# Patient Record
Sex: Female | Born: 1980 | Race: White | Hispanic: No | Smoking: Former smoker
Health system: Southern US, Community
[De-identification: ages and names within clinical notes are randomized; demographics above are authoritative.]

## PROBLEM LIST (undated history)

## (undated) DIAGNOSIS — T7840XA Allergy, unspecified, initial encounter: Secondary | ICD-10-CM

## (undated) DIAGNOSIS — R002 Palpitations: Secondary | ICD-10-CM

## (undated) DIAGNOSIS — C210 Malignant neoplasm of anus, unspecified: Secondary | ICD-10-CM

## (undated) DIAGNOSIS — T8859XA Other complications of anesthesia, initial encounter: Secondary | ICD-10-CM

## (undated) DIAGNOSIS — I499 Cardiac arrhythmia, unspecified: Secondary | ICD-10-CM

## (undated) DIAGNOSIS — F419 Anxiety disorder, unspecified: Secondary | ICD-10-CM

## (undated) HISTORY — DX: Allergy, unspecified, initial encounter: T78.40XA

## (undated) HISTORY — DX: Anxiety disorder, unspecified: F41.9

## (undated) HISTORY — PX: TUBAL LIGATION: SHX77

## (undated) HISTORY — DX: Malignant neoplasm of anus, unspecified: C21.0

---

## 2001-12-08 ENCOUNTER — Other Ambulatory Visit: Admission: RE | Admit: 2001-12-08 | Discharge: 2001-12-08 | Payer: Self-pay | Admitting: Family Medicine

## 2001-12-10 ENCOUNTER — Encounter: Payer: Self-pay | Admitting: Internal Medicine

## 2001-12-10 ENCOUNTER — Ambulatory Visit (HOSPITAL_COMMUNITY): Admission: RE | Admit: 2001-12-10 | Discharge: 2001-12-10 | Payer: Self-pay | Admitting: Internal Medicine

## 2002-07-24 ENCOUNTER — Emergency Department (HOSPITAL_COMMUNITY): Admission: EM | Admit: 2002-07-24 | Discharge: 2002-07-24 | Payer: Self-pay | Admitting: *Deleted

## 2002-11-17 HISTORY — PX: TUBAL LIGATION: SHX77

## 2003-01-18 ENCOUNTER — Ambulatory Visit (HOSPITAL_COMMUNITY): Admission: AD | Admit: 2003-01-18 | Discharge: 2003-01-18 | Payer: Self-pay | Admitting: Obstetrics and Gynecology

## 2003-01-26 ENCOUNTER — Inpatient Hospital Stay (HOSPITAL_COMMUNITY): Admission: AD | Admit: 2003-01-26 | Discharge: 2003-01-29 | Payer: Self-pay | Admitting: Obstetrics and Gynecology

## 2003-04-18 ENCOUNTER — Ambulatory Visit (HOSPITAL_COMMUNITY): Admission: RE | Admit: 2003-04-18 | Discharge: 2003-04-18 | Payer: Self-pay | Admitting: Obstetrics & Gynecology

## 2006-02-26 ENCOUNTER — Emergency Department (HOSPITAL_COMMUNITY): Admission: EM | Admit: 2006-02-26 | Discharge: 2006-02-26 | Payer: Self-pay | Admitting: Emergency Medicine

## 2011-07-21 ENCOUNTER — Emergency Department (HOSPITAL_COMMUNITY)
Admission: EM | Admit: 2011-07-21 | Discharge: 2011-07-21 | Disposition: A | Discharge status: Home / Self Care | Payer: Medicaid Other | Attending: Emergency Medicine | Admitting: Emergency Medicine

## 2011-07-21 DIAGNOSIS — J329 Chronic sinusitis, unspecified: Secondary | ICD-10-CM | POA: Insufficient documentation

## 2011-07-21 DIAGNOSIS — F17200 Nicotine dependence, unspecified, uncomplicated: Secondary | ICD-10-CM | POA: Insufficient documentation

## 2011-07-21 MED ORDER — AMOXICILLIN 500 MG PO CAPS: 500.0000 mg | ORAL_CAPSULE | Freq: Three times a day (TID) | ORAL | Status: AC

## 2011-07-21 NOTE — ED Provider Notes (Signed)
History     CSN: [id number] Arrival date & time: 07/21/2011 11:34 AM  Chief Complaint  Patient presents with  . Sore Throat  . Cough  . Fever   Patient is a 30 y.o. female presenting with pharyngitis, cough, and fever. The history is provided by the patient.  Sore Throat The current episode started in the past 7 days (3 days ago). Associated symptoms include congestion, coughing, a fever and a sore throat. Associated symptoms comments: Sinus pressure with purulent nasal discharge.. Treatments tried: cough syrup. The treatment provided no relief.  Cough Associated symptoms include sore throat.  Fever Primary symptoms of the febrile illness include fever and cough.    History reviewed. No pertinent past medical history.  Past Surgical History  Procedure Date  . Tubal ligation     No family history on file.  History  Substance Use Topics  . Smoking status: Current Everyday Smoker  . Smokeless tobacco: Not on file  . Alcohol Use: No    OB History    Grav Para Term Preterm Abortions TAB SAB Ect Mult Living                  Review of Systems  Constitutional: Positive for fever.  HENT: Positive for congestion and sore throat.   Respiratory: Positive for cough.   All other systems reviewed and are negative.    Physical Exam  BP 127/83  Pulse 99  Temp(Src) 98.8 F (37.1 C) (Oral)  Resp 20  Ht 5\' 6"  (1.676 m)  Wt 170 lb (77.111 kg)  BMI 27.44 kg/m2  SpO2 97%  LMP 07/14/2011  Physical Exam  Nursing note and vitals reviewed. Constitutional: She is oriented to person, place, and time. She appears well-developed and well-nourished.  HENT:  Head: Normocephalic and atraumatic.  Right Ear: Tympanic membrane normal.  Left Ear: Tympanic membrane normal.  Nose: Mucosal edema and rhinorrhea present. Right sinus exhibits maxillary sinus tenderness. Left sinus exhibits maxillary sinus tenderness.  Mouth/Throat: Uvula is midline, oropharynx is clear and moist and mucous  membranes are normal.  Eyes: Conjunctivae are normal.  Neck: Normal range of motion.  Cardiovascular: Normal rate, regular rhythm, normal heart sounds and intact distal pulses.   Pulmonary/Chest: Effort normal and breath sounds normal. She has no wheezes.  Abdominal: Soft. Bowel sounds are normal. There is no tenderness.  Musculoskeletal: Normal range of motion.  Neurological: She is alert and oriented to person, place, and time.  Skin: Skin is warm and dry.  Psychiatric: She has a normal mood and affect.    ED Course  Procedures  MDM Sinusitis.      Candis Musa, PA 07/21/11 1301

## 2011-07-21 NOTE — ED Notes (Signed)
Pt reports sore throat, fever, and cough since Friday.  Pt reports hx of strep throat.

## 2011-07-21 NOTE — ED Notes (Signed)
Cough, sore throat that started Friday, recent exposure to sick contacts

## 2011-07-22 NOTE — ED Provider Notes (Signed)
Medical screening examination/treatment/procedure(s) were performed by non-physician practitioner and as supervising physician I was immediately available for consultation/collaboration.   Sayde Lish L Orpah Hausner, MD 07/22/11 1317 

## 2012-08-11 ENCOUNTER — Encounter (HOSPITAL_COMMUNITY): Payer: Self-pay | Admitting: Emergency Medicine

## 2012-08-11 ENCOUNTER — Emergency Department (HOSPITAL_COMMUNITY): Payer: Medicaid Other

## 2012-08-11 ENCOUNTER — Emergency Department (HOSPITAL_COMMUNITY)
Admission: EM | Admit: 2012-08-11 | Discharge: 2012-08-11 | Disposition: A | Discharge status: Home / Self Care | Payer: Medicaid Other | Attending: Emergency Medicine | Admitting: Emergency Medicine

## 2012-08-11 DIAGNOSIS — R059 Cough, unspecified: Secondary | ICD-10-CM | POA: Insufficient documentation

## 2012-08-11 DIAGNOSIS — F17200 Nicotine dependence, unspecified, uncomplicated: Secondary | ICD-10-CM | POA: Insufficient documentation

## 2012-08-11 DIAGNOSIS — J40 Bronchitis, not specified as acute or chronic: Secondary | ICD-10-CM

## 2012-08-11 DIAGNOSIS — J329 Chronic sinusitis, unspecified: Secondary | ICD-10-CM

## 2012-08-11 DIAGNOSIS — R05 Cough: Secondary | ICD-10-CM | POA: Insufficient documentation

## 2012-08-11 MED ORDER — DOXYCYCLINE HYCLATE 100 MG PO CAPS: 100.0000 mg | ORAL_CAPSULE | Freq: Two times a day (BID) | ORAL | Status: DC

## 2012-08-11 MED ORDER — ALBUTEROL SULFATE HFA 108 (90 BASE) MCG/ACT IN AERS
2.0000 | INHALATION_SPRAY | RESPIRATORY_TRACT | Status: DC
  Administered 2012-08-11: 2 via RESPIRATORY_TRACT
  Filled 2012-08-11: qty 6.7

## 2012-08-11 MED ORDER — PSEUDOEPHEDRINE HCL 60 MG PO TABS: ORAL_TABLET | ORAL | Status: DC

## 2012-08-11 MED ORDER — PROMETHAZINE-CODEINE 6.25-10 MG/5ML PO SYRP: 5.0000 mL | ORAL_SOLUTION | Freq: Four times a day (QID) | ORAL | Status: DC | PRN

## 2012-08-11 MED ORDER — PREDNISONE 20 MG PO TABS
60.0000 mg | ORAL_TABLET | Freq: Once | ORAL | Status: AC
  Administered 2012-08-11: 60 mg via ORAL
  Filled 2012-08-11: qty 3

## 2012-08-11 NOTE — ED Notes (Signed)
Pt c/o cough, runny nose, and chest congestion x 2 months. Pt states she was seen at urgent care and treated for sinus infection.

## 2012-08-11 NOTE — ED Provider Notes (Signed)
History     CSN: [id number]  Arrival date & time 08/11/12  1547   First MD Initiated Contact with Patient 08/11/12 1814      Chief Complaint  Patient presents with  . Cough    (Consider location/radiation/quality/duration/timing/severity/associated sxs/prior treatment) HPI Comments: Patient reports problem with cough runny nose and congestion for approximately 2 months. The patient states that this problem comes and goes. The patient has been seen at the urgent care where she was treated for sinus infection and treated with steroids. She states that she had to stop the steroid because they made her" feel funny" the patient has not had any high fever. She's not had any hemoptysis. His been no unusual tick bites reported. The patient has not had any problems compromising her immune system. It is of note that she is a daily smoker.  The history is provided by the patient.    History reviewed. No pertinent past medical history.  Past Surgical History  Procedure Date  . Tubal ligation     History reviewed. No pertinent family history.  History  Substance Use Topics  . Smoking status: Current Every Day Smoker  . Smokeless tobacco: Not on file  . Alcohol Use: No    OB History    Grav Para Term Preterm Abortions TAB SAB Ect Mult Living                  Review of Systems  Constitutional: Negative for activity change.       All ROS Neg except as noted in HPI  HENT: Positive for congestion and postnasal drip. Negative for nosebleeds and neck pain.   Eyes: Negative for photophobia and discharge.  Respiratory: Positive for cough. Negative for shortness of breath and wheezing.   Cardiovascular: Negative for chest pain and palpitations.  Gastrointestinal: Negative for abdominal pain and blood in stool.  Genitourinary: Negative for dysuria, frequency and hematuria.  Musculoskeletal: Negative for back pain and arthralgias.  Skin: Negative.   Neurological: Negative for dizziness,  seizures and speech difficulty.  Psychiatric/Behavioral: Negative for hallucinations and confusion.    Allergies  Review of patient's allergies indicates no known allergies.  Home Medications   Current Outpatient Rx  Name Route Sig Dispense Refill  . PHENYLEPHRINE-GUAIFENESIN 2.5-50 MG/5ML PO LIQD Oral Take 10 mLs by mouth.        BP 124/85  Pulse 78  Temp 97.7 F (36.5 C) (Oral)  Resp 22  Ht 5\' 7"  (1.702 m)  Wt 200 lb (90.719 kg)  BMI 31.32 kg/m2  SpO2 98%  LMP 07/28/2012  Physical Exam  Nursing note and vitals reviewed. Constitutional: She is oriented to person, place, and time. She appears well-developed and well-nourished.  Non-toxic appearance.  HENT:  Head: Normocephalic.  Right Ear: Tympanic membrane and external ear normal.  Left Ear: Tympanic membrane and external ear normal.       Nasal congestion present  Eyes: EOM and lids are normal. Pupils are equal, round, and reactive to light.  Neck: Normal range of motion. Neck supple. Carotid bruit is not present.  Cardiovascular: Normal rate, regular rhythm, normal heart sounds, intact distal pulses and normal pulses.   Pulmonary/Chest: Breath sounds normal. No respiratory distress.       Course sounds present with bilateral rhonchi and soft wheezes. No retractions of the chest wall.  Abdominal: Soft. Bowel sounds are normal. There is no tenderness. There is no guarding.  Musculoskeletal: Normal range of motion.  Lymphadenopathy:  Head (right side): No submandibular adenopathy present.       Head (left side): No submandibular adenopathy present.    She has no cervical adenopathy.  Neurological: She is alert and oriented to person, place, and time. She has normal strength. No cranial nerve deficit or sensory deficit.  Skin: Skin is warm and dry.  Psychiatric: She has a normal mood and affect. Her speech is normal.    ED Course  Procedures (including critical care time)  Labs Reviewed - No data to display No  results found. Pulse oximetry 98% on room air. Within normal limits by my interpretation.  No diagnosis found.    MDM  I have reviewed nursing notes, vital signs, and all appropriate lab and imaging results for this patient. The chest x-ray is read as normal. The patient is treated with an albuterol inhaler 2 puffs every 4 hours given here in the emergency department. Prescription given for Sudafed 3 times daily for congestion, promethazine cough medication every 6 hours with warning of possible drowsiness. Doxycycline 1 tablet twice daily with food.       Kathie Dike, [place] 08/11/12 1956

## 2012-08-11 NOTE — ED Notes (Signed)
Pt presents with with sore throat, chest discomfort/soreness, non productive cough and generalized body aches. Pt states [facility] and treated with steroid shot and a z-pack with no improvement. BBS with audible rales, equal. NAD noted.

## 2012-08-12 NOTE — ED Provider Notes (Signed)
Medical screening examination/treatment/procedure(s) were performed by non-physician practitioner and as supervising physician I was immediately available for consultation/collaboration. Devoria Albe, MD, FACEP   Ward Givens, MD 08/12/12 [phone]

## 2019-12-17 ENCOUNTER — Emergency Department (HOSPITAL_COMMUNITY)
Admission: EM | Admit: 2019-12-17 | Discharge: 2019-12-17 | Disposition: A | Discharge status: Home / Self Care | Payer: Self-pay | Attending: Emergency Medicine | Admitting: Emergency Medicine

## 2019-12-17 ENCOUNTER — Other Ambulatory Visit: Payer: Self-pay

## 2019-12-17 ENCOUNTER — Encounter (HOSPITAL_COMMUNITY): Payer: Self-pay | Admitting: Emergency Medicine

## 2019-12-17 DIAGNOSIS — K047 Periapical abscess without sinus: Secondary | ICD-10-CM | POA: Insufficient documentation

## 2019-12-17 DIAGNOSIS — F17200 Nicotine dependence, unspecified, uncomplicated: Secondary | ICD-10-CM | POA: Insufficient documentation

## 2019-12-17 MED ORDER — AMOXICILLIN 500 MG PO CAPS
500.0000 mg | ORAL_CAPSULE | Freq: Once | ORAL | Status: AC
  Administered 2019-12-17: 500 mg via ORAL
  Filled 2019-12-17: qty 1

## 2019-12-17 MED ORDER — AMOXICILLIN 500 MG PO CAPS: 500.0000 mg | ORAL_CAPSULE | Freq: Two times a day (BID) | ORAL | 0 refills | Status: DC

## 2019-12-17 MED ORDER — HYDROCODONE-ACETAMINOPHEN 5-325 MG PO TABS: 1.0000 | ORAL_TABLET | Freq: Four times a day (QID) | ORAL | 0 refills | Status: DC | PRN

## 2019-12-17 MED ORDER — IBUPROFEN 200 MG PO TABS
400.0000 mg | ORAL_TABLET | Freq: Once | ORAL | Status: AC
  Administered 2019-12-17: 400 mg via ORAL
  Filled 2019-12-17: qty 2

## 2019-12-17 NOTE — ED Triage Notes (Signed)
Pt woke to left facial swelling

## 2019-12-17 NOTE — ED Provider Notes (Signed)
[facility]-EMERGENCY DEPT Provider Note   CSN: [id number] Arrival date & time: 12/17/19  0320     History Chief Complaint  Patient presents with  . Oral Swelling    Marie Duncan is a 39 y.o. female.  The history is provided by the patient.  Dental Pain Location:  Upper Quality:  Aching Severity:  Moderate Onset quality:  Gradual Duration:  1 day Timing:  Constant Progression:  Worsening Chronicity:  New Relieved by:  Nothing Worsened by:  Jaw movement and pressure Associated symptoms: no difficulty swallowing and no fever   Patient reports left facial swelling and dental pain for the past day.  No visual changes.  No vomiting.      PMH-none Past Surgical History:  Procedure Laterality Date  . TUBAL LIGATION       OB History   No obstetric history on file.     History reviewed. No pertinent family history.  Social History   Tobacco Use  . Smoking status: Current Every Day Smoker  . Smokeless tobacco: Never Used  Substance Use Topics  . Alcohol use: No  . Drug use: No    Home Medications Prior to Admission medications   Medication Sig Start Date End Date Taking? Authorizing Provider  amoxicillin (AMOXIL) 500 MG capsule Take 1 capsule (500 mg total) by mouth 2 (two) times daily. 12/17/19   Zadie Rhine, MD  HYDROcodone-acetaminophen (NORCO/VICODIN) 5-325 MG tablet Take 1 tablet by mouth every 6 (six) hours as needed for severe pain. 12/17/19   Zadie Rhine, MD    Allergies    Penicillins  Review of Systems   Review of Systems  Constitutional: Negative for fever.  HENT: Positive for dental problem. Negative for trouble swallowing.   Gastrointestinal: Negative for vomiting.    Physical Exam Updated Vital Signs BP 108/64 (BP Location: Right Arm)   Pulse 69   Temp 98.1 F (36.7 C) (Oral)   Resp 15   SpO2 98%   Physical Exam CONSTITUTIONAL: Well developed/well nourished HEAD AND FACE:  Normocephalic/atraumatic EYES: EOMI/PERRL, no proptosis ENMT: Mucous membranes moist.  Poor dentition.  No trismus.  No focal abscess noted.  Tenderness along left upper gingiva Mild facial swelling noted. NECK: supple no meningeal signs CV: S1/S2 noted, no murmurs/rubs/gallops noted LUNGS: Lungs are clear to auscultation bilaterally, no apparent distress ABDOMEN: soft, nontender, no rebound or guarding NEURO: Pt is awake/alert, moves all extremitiesx4 EXTREMITIES:full ROM SKIN: warm, color normal  ED Results / Procedures / Treatments   Labs (all labs ordered are listed, but only abnormal results are displayed) Labs Reviewed - No data to display  EKG None  Radiology No results found.  Procedures Procedures  Medications Ordered in ED Medications  amoxicillin (AMOXIL) capsule 500 mg (500 mg Oral Given 12/17/19 0448)  ibuprofen (ADVIL) tablet 400 mg (400 mg Oral Given 12/17/19 0448)    ED Course  I have reviewed the triage vital signs and the nursing notes.     MDM Rules/Calculators/A&P                      Will refer to dentistry.  Amoxicillin was  started, she reports she can take this without any side effects Final Clinical Impression(s) / ED Diagnoses Final diagnoses:  Dental abscess    Rx / DC Orders ED Discharge Orders         Ordered    amoxicillin (AMOXIL) 500 MG capsule  2 times daily  12/17/19 0440    HYDROcodone-acetaminophen (NORCO/VICODIN) 5-325 MG tablet  Every 6 hours PRN     12/17/19 0530           Ripley Fraise, MD 12/17/19 [phone]

## 2019-12-18 ENCOUNTER — Encounter (HOSPITAL_COMMUNITY): Payer: Self-pay | Admitting: Emergency Medicine

## 2019-12-18 ENCOUNTER — Other Ambulatory Visit: Payer: Self-pay

## 2019-12-18 ENCOUNTER — Emergency Department (HOSPITAL_COMMUNITY)
Admission: EM | Admit: 2019-12-18 | Discharge: 2019-12-18 | Disposition: A | Discharge status: Home / Self Care | Payer: Self-pay | Attending: Emergency Medicine | Admitting: Emergency Medicine

## 2019-12-18 DIAGNOSIS — F17210 Nicotine dependence, cigarettes, uncomplicated: Secondary | ICD-10-CM | POA: Insufficient documentation

## 2019-12-18 DIAGNOSIS — K0889 Other specified disorders of teeth and supporting structures: Secondary | ICD-10-CM

## 2019-12-18 DIAGNOSIS — K047 Periapical abscess without sinus: Secondary | ICD-10-CM

## 2019-12-18 DIAGNOSIS — R220 Localized swelling, mass and lump, head: Secondary | ICD-10-CM

## 2019-12-18 MED ORDER — CHLORHEXIDINE GLUCONATE 0.12 % MT SOLN: 15.0000 mL | Freq: Two times a day (BID) | OROMUCOSAL | 0 refills | Status: DC

## 2019-12-18 MED ORDER — CLINDAMYCIN HCL 150 MG PO CAPS: 300.0000 mg | ORAL_CAPSULE | Freq: Three times a day (TID) | ORAL | 0 refills | Status: AC

## 2019-12-18 MED ORDER — IBUPROFEN 600 MG PO TABS: 600.0000 mg | ORAL_TABLET | Freq: Four times a day (QID) | ORAL | 0 refills | Status: DC | PRN

## 2019-12-18 NOTE — Discharge Instructions (Addendum)
Please take all of your antibiotics until finished!  Stop taking amoxicillin and start taking clindamycin.  Take your antibiotics with food.  Common side effects of antibiotics include nausea, vomiting, abdominal discomfort, and diarrhea. You may help offset some of this with probiotics which you can buy or get in yogurt. Do not eat  or take the probiotics until 2 hours after your antibiotic.    Some studies suggest that certain antibiotics can reduce the efficacy of certain oral contraceptive pills (birth control), so please use additional contraceptives (condoms or other barrier method) while you are taking the antibiotics and for an additional 5 to 7 days afterwards if you are a female on these medications.   Apply warm or cool compresses to jaw throughout the day, whichever feels best.  20 minutes at a time then at least 30 minutes off before applying again.  Alternate 600 mg of ibuprofen with food and [phone] mg of Tylenol every 3 hours as needed for pain. Do not exceed 4000 mg of Tylenol daily.  You may also use warm water salt gargles, Orajel, or other over-the-counter dental pain remedies. Use Peridex mouthwash twice daily.  Followup with a dentist is very important for ongoing evaluation and management of recurrent dental pain.  I have given you the information for the dentist on-call today as well as resources for other dentists in the area that are more affordable.  Return to emergency department for emergent changing or worsening symptoms such as fever, worsening facial swelling, difficulty breathing or swallowing, throat tightness, or vision changes.

## 2019-12-18 NOTE — ED Provider Notes (Signed)
[place] DEPT Provider Note   CSN: [id number] Arrival date & time: 12/18/19  0844     History Chief Complaint  Patient presents with   Dental Pain    Marie Duncan is a 39 y.o. female presents today for evaluation of acute onset, progressively worsening left maxillary dental pain for 3 days.  Reports feeling mild aching pain along the left maxilla with mild swelling on Friday.  Pain is throbbing, radiates all over the left side of the face. She was seen in the ED yesterday due to worsening symptoms and was started on amoxicillin and given hydrocodone and sent home.  She reports that when she awoke today her pain was worse and she had swelling under her left eyelid.  She denies eye pain, abnormal drainage from the eye, vision changes, pain with eye movements, fevers or injury to the eye.  She does not wear contact lenses.  She has been drinking cold water and applying cool compresses with improvement.  She also took Advil PM and hydrocodone with some relief.  She denies difficulty eating or swallowing.  She called the dentist she was referred to yesterday but reports they are not open until Tuesday.  She has had 3 doses of amoxicillin so far.  The history is provided by the patient.       History reviewed. No pertinent past medical history.  There are no problems to display for this patient.   Past Surgical History:  Procedure Laterality Date   TUBAL LIGATION       OB History   No obstetric history on file.     No family history on file.  Social History   Tobacco Use   Smoking status: Current Every Day Smoker   Smokeless tobacco: Never Used  Substance Use Topics   Alcohol use: No   Drug use: No    Home Medications Prior to Admission medications   Medication Sig Start Date End Date Taking? Authorizing Provider  amoxicillin (AMOXIL) 500 MG capsule Take 1 capsule (500 mg total) by mouth 2 (two) times daily. 12/17/19   Ripley Fraise, MD  chlorhexidine (PERIDEX) 0.12 % solution Use as directed 15 mLs in the mouth or throat 2 (two) times daily. 12/18/19   Nils Flack, Brookelin Felber A, PA-C  clindamycin (CLEOCIN) 150 MG capsule Take 2 capsules (300 mg total) by mouth 3 (three) times daily for 7 days. 12/18/19 12/25/19  Rodell Perna A, PA-C  HYDROcodone-acetaminophen (NORCO/VICODIN) 5-325 MG tablet Take 1 tablet by mouth every 6 (six) hours as needed for severe pain. 12/17/19   Ripley Fraise, MD  ibuprofen (ADVIL) 600 MG tablet Take 1 tablet (600 mg total) by mouth every 6 (six) hours as needed. 12/18/19   Snyder Colavito A, PA-C    Allergies    Penicillins  Review of Systems   Review of Systems  Constitutional: Negative for chills and fever.  HENT: Positive for dental problem and facial swelling. Negative for trouble swallowing and voice change.   Eyes: Negative for photophobia, pain, discharge, redness, itching and visual disturbance.  Respiratory: Negative for shortness of breath.   Musculoskeletal: Negative for neck pain and neck stiffness.  All other systems reviewed and are negative.   Physical Exam Updated Vital Signs BP (!) 151/84 (BP Location: Left Arm)    Pulse 87    Temp 97.8 F (36.6 C) (Oral)    Resp 18    SpO2 100%   Physical Exam Vitals and nursing note reviewed.  Constitutional:      General: She is not in acute distress.    Appearance: She is well-developed.  HENT:     Head: Atraumatic.     Mouth/Throat:     Comments: Diffusely decaying dentition.  Patient with tenderness to percussion of left first maxillary molar.  Mild gingival irritation noted.  Mouth opens to around 3 finger widths.  Tolerating secretions without difficulty.  No abnormal phonation.  No abnormalities of the posterior oropharynx or sublingual region.  No submandibular or submental tenderness.  No tenderness to anterior structures of neck. Eyes:     General:        Right eye: No discharge.        Left eye: No discharge.     Extraocular  Movements: Extraocular movements intact.     Conjunctiva/sclera: Conjunctivae normal.     Pupils: Pupils are equal, round, and reactive to light.     Comments: See below image.  Patient with swelling to the left lower eyelid.  Mild discomfort on palpation.  No erythema or induration.  No abnormal drainage.  No conjunctival injection.  No chemosis, proptosis or consensual photophobia.  No pain or restriction with EOMs.  Neck:     Vascular: No JVD.     Trachea: No tracheal deviation.  Cardiovascular:     Rate and Rhythm: Normal rate.  Pulmonary:     Effort: Pulmonary effort is normal.  Abdominal:     General: There is no distension.  Musculoskeletal:     Cervical back: Normal range of motion and neck supple. No rigidity or tenderness.  Skin:    General: Skin is warm and dry.     Findings: No erythema.  Neurological:     Mental Status: She is alert.  Psychiatric:        Behavior: Behavior normal.       ED Results / Procedures / Treatments   Labs (all labs ordered are listed, but only abnormal results are displayed) Labs Reviewed - No data to display  EKG None  Radiology No results found.  Procedures Procedures (including critical care time)  Medications Ordered in ED Medications - No data to display  ED Course  I have reviewed the triage vital signs and the nursing notes.  Pertinent labs & imaging results that were available during my care of the patient were reviewed by me and considered in my medical decision making (see chart for details).    MDM Rules/Calculators/A&P                      Patient with dental pain with associated abscess, though this is not amenable to drainage in the ED. Patient is afebrile, vital signs are stable (she is mildly hypertensive though this is likely secondary to her pain).  Patient is nontoxic in appearance and is tolerating secretions without difficulty.  Exam unconcerning for Ludwig's angina, peritonsillar abscess, strep  pharyngitis, meningitis, deep space neck infection. She has left lower eyelid swelling but no erythema or abnormal drainage. No restriction or pain with eye movements, no vision changes. Doubt orbital cellulitis or ocular nerve entrapment at this time.  No evidence of airway compromise, she is able to tolerate sips of fluid in the ED.  We will switch her from amoxil to clindamycin.  Also discussed utility of anti-inflammatories, Tylenol, Peridex mouthwash.  We discussed the importance of follow-up with a dentist outpatient and discussed strict ED return precautions if she is to develop any changing  or worsening symptoms such as fever, pain with eye movements, vision changes, difficulty breathing or swallowing, neck stiffness, drooling or choking. Patient verbalized understanding of and agreement with plan and is safe for discharge home at this time. Discussed with Dr. Effie Shy who agrees with assessment and plan at this time.  Final Clinical Impression(s) / ED Diagnoses Final diagnoses:  Dental abscess  Pain, dental  Facial swelling    Rx / DC Orders ED Discharge Orders         Ordered    clindamycin (CLEOCIN) 150 MG capsule  3 times daily     12/18/19 0958    chlorhexidine (PERIDEX) 0.12 % solution  2 times daily     12/18/19 0958    ibuprofen (ADVIL) 600 MG tablet  Every 6 hours PRN     12/18/19 0958           Jeanie Sewer, PA-C 12/18/19 1004    Mancel Bale, MD 12/18/19 1528

## 2019-12-18 NOTE — ED Triage Notes (Signed)
Patient here from home with complaints of left sided dental pain. Reports being seen for same yesterday. Meds with no relief.

## 2019-12-18 NOTE — ED Notes (Signed)
Patient PO challenged, no difficulties. Resources guide explained, also dental resourse guide explained.

## 2021-10-30 ENCOUNTER — Emergency Department (HOSPITAL_COMMUNITY): Payer: Self-pay

## 2021-10-30 ENCOUNTER — Encounter (HOSPITAL_COMMUNITY): Payer: Self-pay

## 2021-10-30 ENCOUNTER — Emergency Department (HOSPITAL_COMMUNITY)
Admission: EM | Admit: 2021-10-30 | Discharge: 2021-10-30 | Disposition: A | Discharge status: Home / Self Care | Payer: Self-pay | Attending: Emergency Medicine | Admitting: Emergency Medicine

## 2021-10-30 ENCOUNTER — Other Ambulatory Visit: Payer: Self-pay

## 2021-10-30 DIAGNOSIS — F17200 Nicotine dependence, unspecified, uncomplicated: Secondary | ICD-10-CM | POA: Insufficient documentation

## 2021-10-30 DIAGNOSIS — U071 COVID-19: Secondary | ICD-10-CM | POA: Insufficient documentation

## 2021-10-30 NOTE — Discharge Instructions (Signed)
Please use Tylenol or ibuprofen for pain.  You may use 600 mg ibuprofen every 6 hours or 1000 mg of Tylenol every 6 hours.  You may choose to alternate between the 2.  This would be most effective.  Not to exceed 4 g of Tylenol within 24 hours.  Not to exceed 3200 mg ibuprofen 24 hours.  Tylenol additionally helps with fevers should you develop one. I encourage plenty of fluids, rest. Quarantine for 5 days from positive test yesterday, and strict masking for an additional 5 days. I encourage you to discontinue tobacco smoking preferably forever, but at least for the duration of your illness  If your symptoms significantly worsen including persistent chest pain, worsening shortness of breath, please return for further evaluation.

## 2021-10-30 NOTE — ED Provider Notes (Signed)
[facility]-EMERGENCY DEPT Provider Note   CSN: [id number] Arrival date & time: 10/30/21  1230     History Chief Complaint  Patient presents with   Shortness of Breath   Back Pain    Marie Duncan is a 40 y.o. female with a past medical history significant for tobacco smoking who presents with body aches, fatigue, chills shortness of breath began on Thursday.  Patient reports body aches, and pain in back with deep breaths.  Patient reports that she had home COVID test yesterday that was positive.  Patient denies chest pain.  Patient denies nausea, vomiting.  Patient reports she has not tried anything at this time.  Pain is described as achy in nature, 6/10.  She is worried she may have pneumonia.  Shortness of Breath Back Pain     History reviewed. No pertinent past medical history.  There are no problems to display for this patient.   Past Surgical History:  Procedure Laterality Date   TUBAL LIGATION       OB History   No obstetric history on file.     History reviewed. No pertinent family history.  Social History   Tobacco Use   Smoking status: Every Day   Smokeless tobacco: Never  Substance Use Topics   Alcohol use: No   Drug use: No    Home Medications Prior to Admission medications   Medication Sig Start Date End Date Taking? Authorizing Provider  amoxicillin (AMOXIL) 500 MG capsule Take 1 capsule (500 mg total) by mouth 2 (two) times daily. 12/17/19   Zadie Rhine, MD  chlorhexidine (PERIDEX) 0.12 % solution Use as directed 15 mLs in the mouth or throat 2 (two) times daily. 12/18/19   Fawze, Mina A, PA-C  HYDROcodone-acetaminophen (NORCO/VICODIN) 5-325 MG tablet Take 1 tablet by mouth every 6 (six) hours as needed for severe pain. 12/17/19   Zadie Rhine, MD  ibuprofen (ADVIL) 600 MG tablet Take 1 tablet (600 mg total) by mouth every 6 (six) hours as needed. 12/18/19   Fawze, Mina A, PA-C    Allergies    Penicillins  Review  of Systems   Review of Systems  Respiratory:  Positive for shortness of breath.   Musculoskeletal:  Positive for back pain.  All other systems reviewed and are negative.  Physical Exam Updated Vital Signs BP (!) 149/81    Pulse 64    Temp 97.8 F (36.6 C) (Oral)    Resp (!) 25    SpO2 96%   Physical Exam Vitals and nursing note reviewed.  Constitutional:      General: She is not in acute distress.    Appearance: Normal appearance.  HENT:     Head: Normocephalic and atraumatic.  Eyes:     General:        Right eye: No discharge.        Left eye: No discharge.  Cardiovascular:     Rate and Rhythm: Normal rate and regular rhythm.     Heart sounds: No murmur heard.   No friction rub. No gallop.  Pulmonary:     Effort: Pulmonary effort is normal.     Breath sounds: Normal breath sounds.     Comments: Intermittent tachypnea, however no respiratory distress, no accessory breath sounds noted.  No focal consolidation, wheezing, rhonchi, rales. Abdominal:     General: Bowel sounds are normal.     Palpations: Abdomen is soft.  Skin:    General: Skin is warm and  dry.     Capillary Refill: Capillary refill takes less than 2 seconds.  Neurological:     Mental Status: She is alert and oriented to person, place, and time.  Psychiatric:        Mood and Affect: Mood normal.        Behavior: Behavior normal.    ED Results / Procedures / Treatments   Labs (all labs ordered are listed, but only abnormal results are displayed) Labs Reviewed - No data to display  EKG None  Radiology DG Chest 2 View  Result Date: 10/30/2021 CLINICAL DATA:  Patient complains of bilateral upper back pain and sob EXAM: CHEST - 2 VIEW COMPARISON:  08/11/2012 FINDINGS: Lungs are clear. Heart size and mediastinal contours are within normal limits. No effusion.  No pneumothorax. Visualized bones unremarkable. IMPRESSION: No acute cardiopulmonary disease. Electronically Signed   By: Corlis Leak M.D.   On:  10/30/2021 13:02    Procedures Procedures   Medications Ordered in ED Medications - No data to display  ED Course  I have reviewed the triage vital signs and the nursing notes.  Pertinent labs & imaging results that were available during my care of the patient were reviewed by me and considered in my medical decision making (see chart for details).    MDM Rules/Calculators/A&P                         Overall well-appearing female presents with fatigue, shortness of breath, body aches.  Patient is positive for COVID-19.  Patient with no significant medical history, risk factors other than tobacco use.  Patient with complaint of back pain, concern for pneumonia.  EKG nonischemic, minimal clinical concern for PE based on presentation.  Chest x-ray without any evidence of pneumonia.  Patient is afebrile, I do not auscultate any focal consolidation during my exam.  Discussed normal expected course of COVID infection, and discharged in stable condition.  Stable vital signs. Return precautions given. Final Clinical Impression(s) / ED Diagnoses Final diagnoses:  COVID-19    Rx / DC Orders ED Discharge Orders     None        [place] 10/30/21 1628    Bethann Berkshire, MD 11/01/21 2336

## 2021-10-30 NOTE — ED Triage Notes (Signed)
Pt reports fatigue and body aches since last Wednesday. She reports SHOB began on Thursday. She also reports severe back pain pain that began yesterday. She states that she had a positive COVID test yesterday.

## 2022-12-28 IMAGING — CR DG CHEST 2V
2 series · 2 of 2 positions shown · non-contrast
Comparison: 08/11/2012

CLINICAL DATA: Patient complains of bilateral upper back pain and
sob

EXAM:
CHEST - 2 VIEW

[w chest pa]
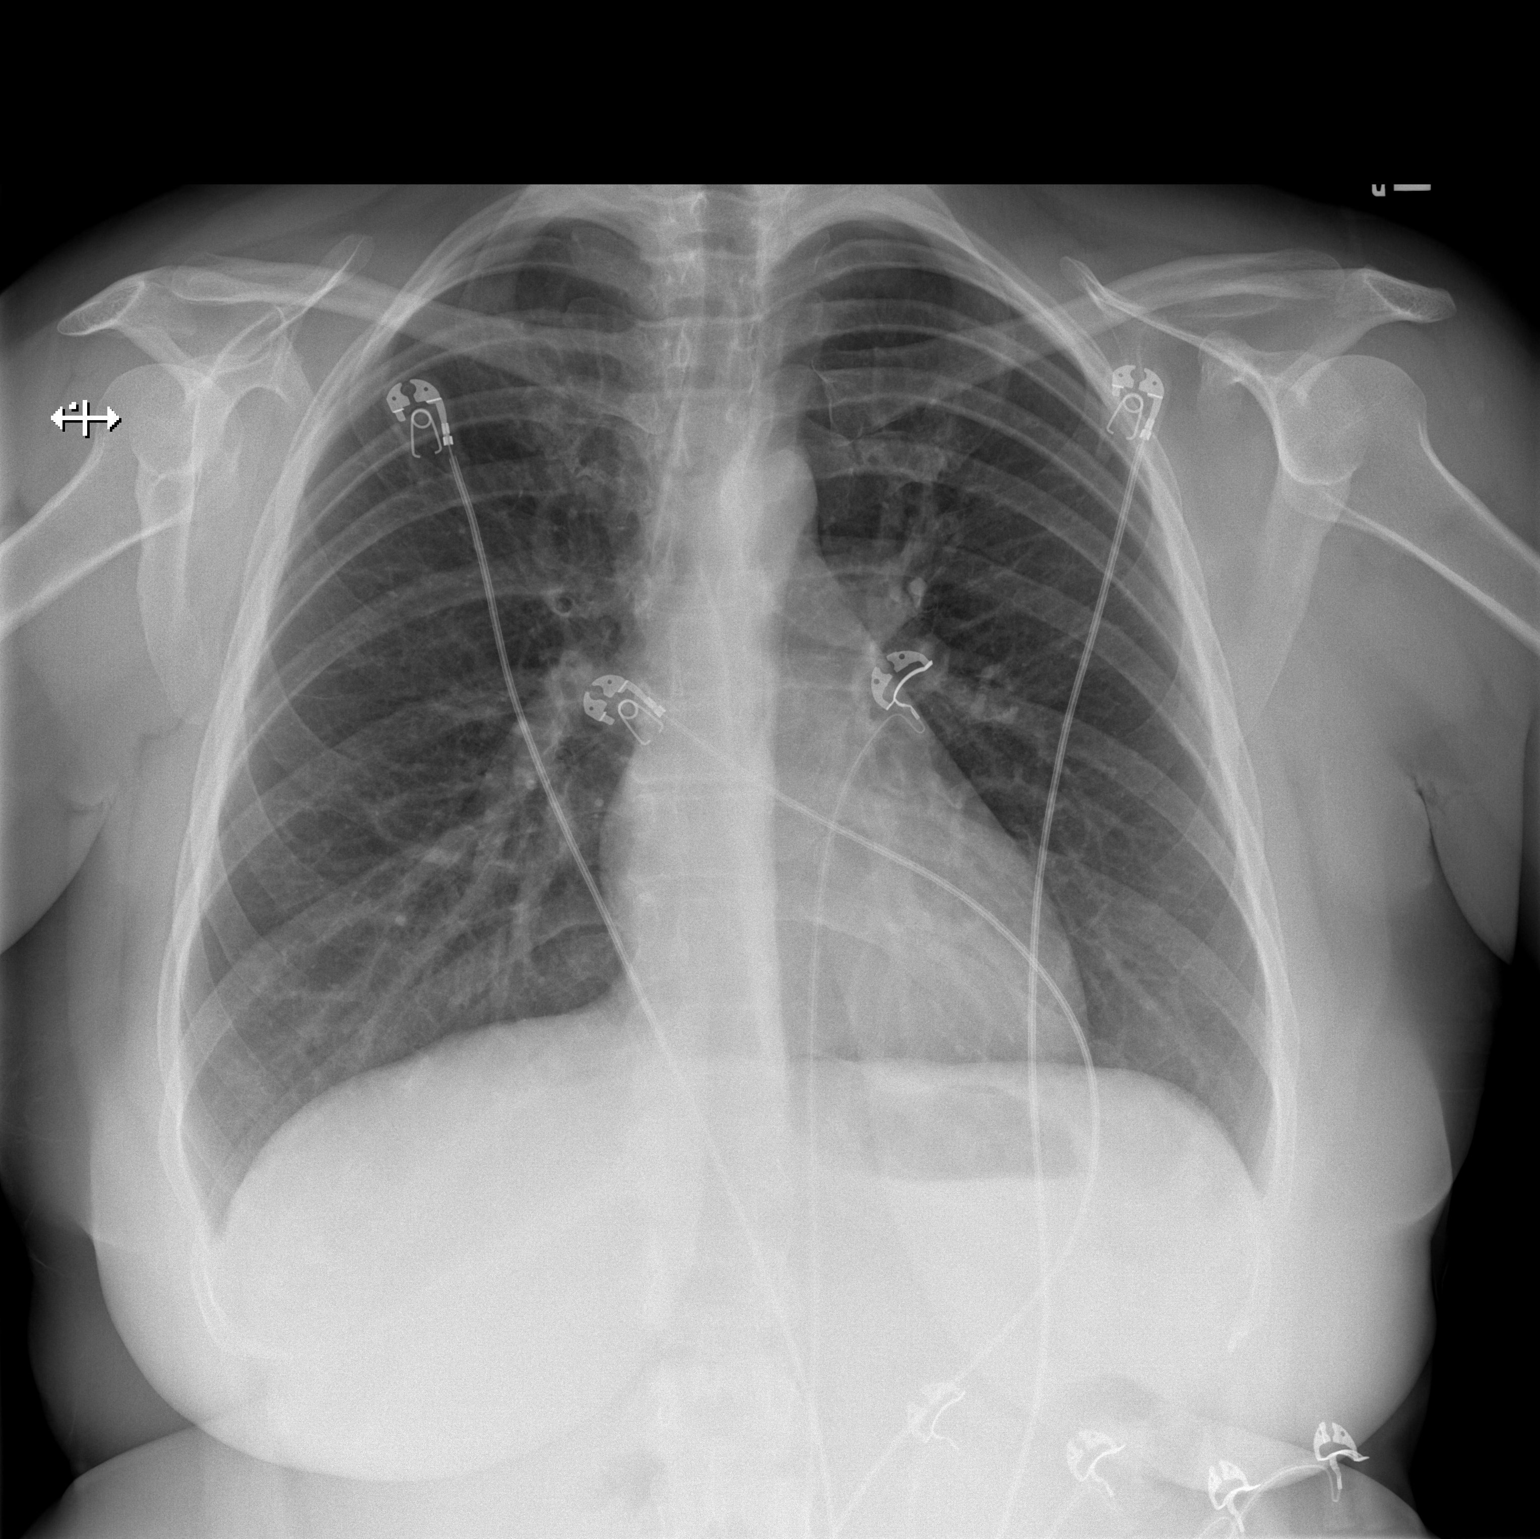

[w chest lat]
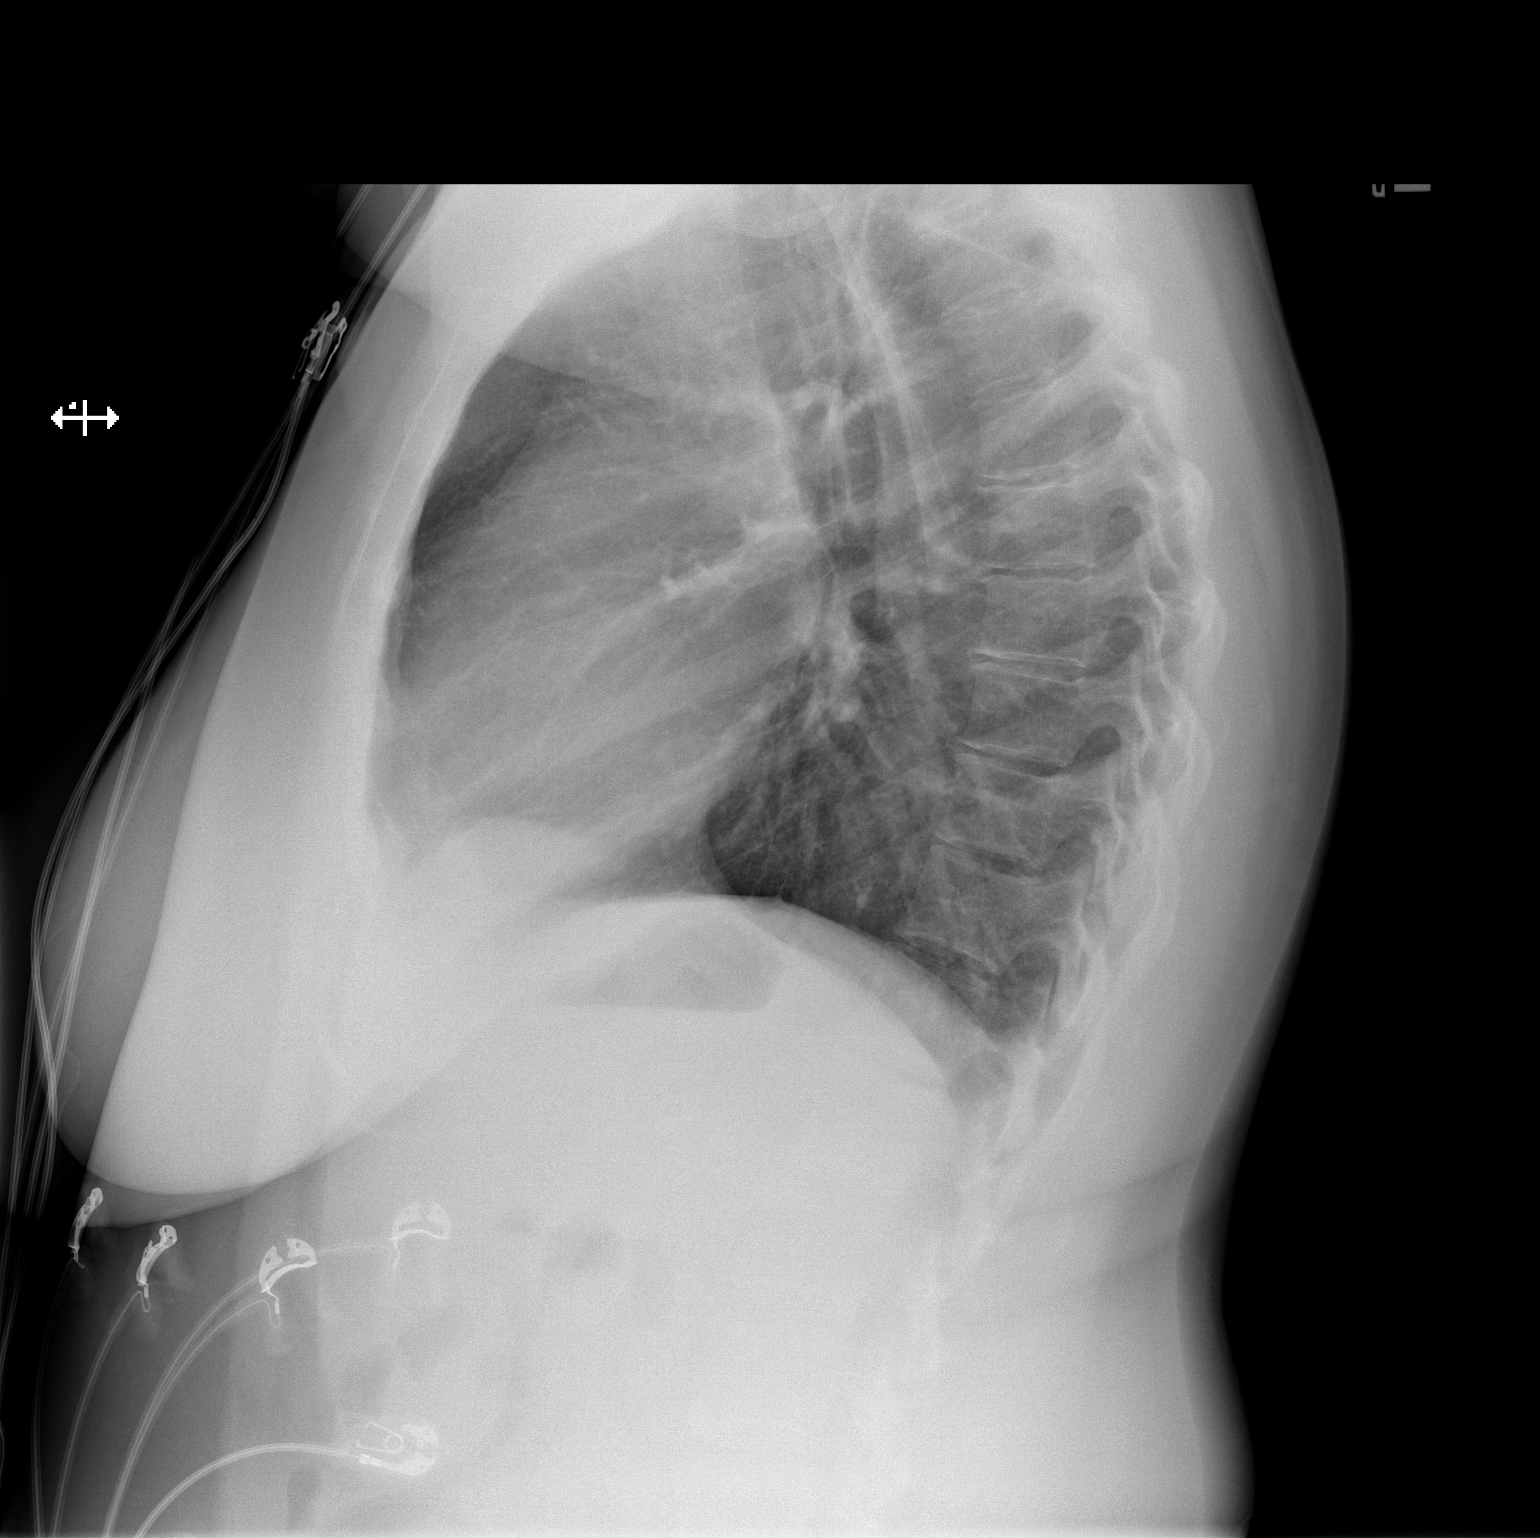

[2 of 2 positions shown; findings below may reference images not displayed]

FINDINGS: Lungs are clear.

Heart size and mediastinal contours are within normal limits.

No effusion.  No pneumothorax.

Visualized bones unremarkable.
IMPRESSION: No acute cardiopulmonary disease.

## 2023-03-18 DIAGNOSIS — C210 Malignant neoplasm of anus, unspecified: Secondary | ICD-10-CM

## 2023-03-18 HISTORY — DX: Malignant neoplasm of anus, unspecified: C21.0

## 2023-03-20 ENCOUNTER — Other Ambulatory Visit (HOSPITAL_COMMUNITY): Payer: Self-pay | Admitting: Internal Medicine

## 2023-03-20 DIAGNOSIS — E049 Nontoxic goiter, unspecified: Secondary | ICD-10-CM

## 2023-03-26 ENCOUNTER — Ambulatory Visit (INDEPENDENT_AMBULATORY_CARE_PROVIDER_SITE_OTHER): Payer: 59 | Admitting: Surgery

## 2023-03-26 ENCOUNTER — Encounter: Payer: Self-pay | Admitting: Surgery

## 2023-03-26 VITALS — BP 119/72 | HR 86 | Temp 98.0°F | Resp 14 | Ht 67.0 in | Wt 176.0 lb

## 2023-03-26 DIAGNOSIS — K629 Disease of anus and rectum, unspecified: Secondary | ICD-10-CM | POA: Diagnosis not present

## 2023-03-27 NOTE — Patient Instructions (Addendum)
Marie Duncan  03/27/2023     @PREFPERIOPPHARMACY @   Your procedure is scheduled on  04/01/2023.   Report to [facility] at  0700 A.M.   Call this number if you have problems the morning of surgery:  [phone]  If you experience any cold or flu symptoms such as cough, fever, chills, shortness of breath, etc. between now and your scheduled surgery, please notify us at the above number.   Remember:  Do not eat or drink after midnight.      Take these medicines the morning of surgery with A SIP OF WATER                                                        None.     Do not wear jewelry, make-up or nail polish.  Do not wear lotions, powders, or perfumes, or deodorant.  Do not shave 48 hours prior to surgery.  Men may shave face and neck.  Do not bring valuables to the hospital.  [facility] is not responsible for any belongings or valuables.  Contacts, dentures or bridgework may not be worn into surgery.  Leave your suitcase in the car.  After surgery it may be brought to your room.  For patients admitted to the hospital, discharge time will be determined by your treatment team.  Patients discharged the day of surgery will not be allowed to drive home and must have someone with them for 24 hours.    Special instructions:   DO NOT smoke tobacco or vape for 24 hours before your procedure.  Please read over the following fact sheets that you were given. Coughing and Deep Breathing, Surgical Site Infection Prevention, Anesthesia Post-op Instructions, and Care and Recovery After Surgery .      General Anesthesia, Adult, Care After The following information offers guidance on how to care for yourself after your procedure. Your health care provider may also give you more specific instructions. If you have problems or questions, contact your health care provider. What can I expect after the procedure? After the procedure, it is common for people to: Have pain or  discomfort at the IV site. Have nausea or vomiting. Have a sore throat or hoarseness. Have trouble concentrating. Feel cold or chills. Feel weak, sleepy, or tired (fatigue). Have soreness and body aches. These can affect parts of the body that were not involved in surgery. Follow these instructions at home: For the time period you were told by your health care provider:  Rest. Do not participate in activities where you could fall or become injured. Do not drive or use machinery. Do not drink alcohol. Do not take sleeping pills or medicines that cause drowsiness. Do not make important decisions or sign legal documents. Do not take care of children on your own. General instructions Drink enough fluid to keep your urine pale yellow. If you have sleep apnea, surgery and certain medicines can increase your risk for breathing problems. Follow instructions from your health care provider about wearing your sleep device: Anytime you are sleeping, including during daytime naps. While taking prescription pain medicines, sleeping medicines, or medicines that make you drowsy. Return to your normal activities as told by your health care provider. Ask your health care provider what activities are safe  for you. Take over-the-counter and prescription medicines only as told by your health care provider. Do not use any products that contain nicotine or tobacco. These products include cigarettes, chewing tobacco, and vaping devices, such as e-cigarettes. These can delay incision healing after surgery. If you need help quitting, ask your health care provider. Contact a health care provider if: You have nausea or vomiting that does not get better with medicine. You vomit every time you eat or drink. You have pain that does not get better with medicine. You cannot urinate or have bloody urine. You develop a skin rash. You have a fever. Get help right away if: You have trouble breathing. You have chest  pain. You vomit blood. These symptoms may be an emergency. Get help right away. Call 911. Do not wait to see if the symptoms will go away. Do not drive yourself to the hospital. Summary After the procedure, it is common to have a sore throat, hoarseness, nausea, vomiting, or to feel weak, sleepy, or fatigue. For the time period you were told by your health care provider, do not drive or use machinery. Get help right away if you have difficulty breathing, have chest pain, or vomit blood. These symptoms may be an emergency. This information is not intended to replace advice given to you by your health care provider. Make sure you discuss any questions you have with your health care provider. Document Revised: 01/31/2022 Document Reviewed: 01/31/2022 Elsevier Patient Education  2023 [employer] How to Use Chlorhexidine Before Surgery Chlorhexidine gluconate (CHG) is a germ-killing (antiseptic) solution that is used to clean the skin. It can get rid of the bacteria that normally live on the skin and can keep them away for about 24 hours. To clean your skin with CHG, you may be given: A CHG solution to use in the shower or as part of a sponge bath. A prepackaged cloth that contains CHG. Cleaning your skin with CHG may help lower the risk for infection: While you are staying in the intensive care unit of the hospital. If you have a vascular access, such as a central line, to provide short-term or long-term access to your veins. If you have a catheter to drain urine from your bladder. If you are on a ventilator. A ventilator is a machine that helps you breathe by moving air in and out of your lungs. After surgery. What are the risks? Risks of using CHG include: A skin reaction. Hearing loss, if CHG gets in your ears and you have a perforated eardrum. Eye injury, if CHG gets in your eyes and is not rinsed out. The CHG product catching fire. Make sure that you avoid smoking and flames after  applying CHG to your skin. Do not use CHG: If you have a chlorhexidine allergy or have previously reacted to chlorhexidine. On babies younger than 59 months of age. How to use CHG solution Use CHG only as told by your health care provider, and follow the instructions on the label. Use the full amount of CHG as directed. Usually, this is one bottle. During a shower Follow these steps when using CHG solution during a shower (unless your health care provider gives you different instructions): Start the shower. Use your normal soap and shampoo to wash your face and hair. Turn off the shower or move out of the shower stream. Pour the CHG onto a clean washcloth. Do not use any type of brush or rough-edged sponge. Starting at your neck, lather your  body down to your toes. Make sure you follow these instructions: If you will be having surgery, pay special attention to the part of your body where you will be having surgery. Scrub this area for at least 1 minute. Do not use CHG on your head or face. If the solution gets into your ears or eyes, rinse them well with water. Avoid your genital area. Avoid any areas of skin that have broken skin, cuts, or scrapes. Scrub your back and under your arms. Make sure to wash skin folds. Let the lather sit on your skin for 1-2 minutes or as long as told by your health care provider. Thoroughly rinse your entire body in the shower. Make sure that all body creases and crevices are rinsed well. Dry off with a clean towel. Do not put any substances on your body afterward--such as powder, lotion, or perfume--unless you are told to do so by your health care provider. Only use lotions that are recommended by the manufacturer. Put on clean clothes or pajamas. If it is the night before your surgery, sleep in clean sheets.  During a sponge bath Follow these steps when using CHG solution during a sponge bath (unless your health care provider gives you different  instructions): Use your normal soap and shampoo to wash your face and hair. Pour the CHG onto a clean washcloth. Starting at your neck, lather your body down to your toes. Make sure you follow these instructions: If you will be having surgery, pay special attention to the part of your body where you will be having surgery. Scrub this area for at least 1 minute. Do not use CHG on your head or face. If the solution gets into your ears or eyes, rinse them well with water. Avoid your genital area. Avoid any areas of skin that have broken skin, cuts, or scrapes. Scrub your back and under your arms. Make sure to wash skin folds. Let the lather sit on your skin for 1-2 minutes or as long as told by your health care provider. Using a different clean, wet washcloth, thoroughly rinse your entire body. Make sure that all body creases and crevices are rinsed well. Dry off with a clean towel. Do not put any substances on your body afterward--such as powder, lotion, or perfume--unless you are told to do so by your health care provider. Only use lotions that are recommended by the manufacturer. Put on clean clothes or pajamas. If it is the night before your surgery, sleep in clean sheets. How to use CHG prepackaged cloths Only use CHG cloths as told by your health care provider, and follow the instructions on the label. Use the CHG cloth on clean, dry skin. Do not use the CHG cloth on your head or face unless your health care provider tells you to. When washing with the CHG cloth: Avoid your genital area. Avoid any areas of skin that have broken skin, cuts, or scrapes. Before surgery Follow these steps when using a CHG cloth to clean before surgery (unless your health care provider gives you different instructions): Using the CHG cloth, vigorously scrub the part of your body where you will be having surgery. Scrub using a back-and-forth motion for 3 minutes. The area on your body should be completely wet  with CHG when you are done scrubbing. Do not rinse. Discard the cloth and let the area air-dry. Do not put any substances on the area afterward, such as powder, lotion, or perfume. Put on clean clothes  or pajamas. If it is the night before your surgery, sleep in clean sheets.  For general bathing Follow these steps when using CHG cloths for general bathing (unless your health care provider gives you different instructions). Use a separate CHG cloth for each area of your body. Make sure you wash between any folds of skin and between your fingers and toes. Wash your body in the following order, switching to a new cloth after each step: The front of your neck, shoulders, and chest. Both of your arms, under your arms, and your hands. Your stomach and groin area, avoiding the genitals. Your right leg and foot. Your left leg and foot. The back of your neck, your back, and your buttocks. Do not rinse. Discard the cloth and let the area air-dry. Do not put any substances on your body afterward--such as powder, lotion, or perfume--unless you are told to do so by your health care provider. Only use lotions that are recommended by the manufacturer. Put on clean clothes or pajamas. Contact a health care provider if: Your skin gets irritated after scrubbing. You have questions about using your solution or cloth. You swallow any chlorhexidine. Call your local poison control center (5400222118 in the [place] Get help right away if: Your eyes itch badly, or they become very red or swollen. Your skin itches badly and is red or swollen. Your hearing changes. You have trouble seeing. You have swelling or tingling in your mouth or throat. You have trouble breathing. These symptoms may represent a serious problem that is an emergency. Do not wait to see if the symptoms will go away. Get medical help right away. Call your local emergency services (911 in the [place] Do not drive yourself to the  hospital. Summary Chlorhexidine gluconate (CHG) is a germ-killing (antiseptic) solution that is used to clean the skin. Cleaning your skin with CHG may help to lower your risk for infection. You may be given CHG to use for bathing. It may be in a bottle or in a prepackaged cloth to use on your skin. Carefully follow your health care provider's instructions and the instructions on the product label. Do not use CHG if you have a chlorhexidine allergy. Contact your health care provider if your skin gets irritated after scrubbing. This information is not intended to replace advice given to you by your health care provider. Make sure you discuss any questions you have with your health care provider. Document Revised: 03/03/2022 Document Reviewed: 01/14/2021 Elsevier Patient Education  2023 [employer].

## 2023-03-29 NOTE — Progress Notes (Signed)
[facility] History and Physical  Reason for Referral: Hemorrhoids Referring Physician: Dr. Fosco  Chief Complaint   New Patient (Initial Visit)     Marie Duncan is a 42 y.o. female.  HPI: Patient presents for evaluation of a possible hemorrhoid.  The area has been present for 3 to 4 months.  It occasionally causes her pain.  She has a history of hemorrhoids after pregnancy.  She has a bowel movement every 2 days and does occasionally need to strain with bowel movements.  She denies using any kind of bowel regimen.  She will occasionally note some blood with her bowel movements, and it is on toilet paper with wiping.  She has never had a colonoscopy.  Her past medical history significant for insomnia.  Her surgical history is significant for tubal ligation.  She denies use of blood thinning medications.  She quit smoking in March of this year.  She will occasionally smoke marijuana and consumes alcohol.  No past medical history on file.  Past Surgical History:  Procedure Laterality Date   TUBAL LIGATION      No family history on file.  Social History   Tobacco Use   Smoking status: Every Day   Smokeless tobacco: Former    Quit date: 01/17/2023  Substance Use Topics   Alcohol use: No   Drug use: No    Medications: I have reviewed the patient's current medications. Allergies as of 03/26/2023       Reactions   Penicillins    unknown        Medication List        Accurate as of Mar 26, 2023 11:59 PM. If you have any questions, ask your nurse or doctor.          STOP taking these medications    amoxicillin 500 MG capsule Commonly known as: AMOXIL Stopped by: Shakeila Pfarr A Ladeana Laplant, DO   chlorhexidine 0.12 % solution Commonly known as: PERIDEX Stopped by: Ricquel Foulk A Arnella Pralle, DO   HYDROcodone-acetaminophen 5-325 MG tablet Commonly known as: NORCO/VICODIN Stopped by: Rosabell Geyer A Betta Balla, DO   ibuprofen 600 MG tablet Commonly known  as: ADVIL Stopped by: Marli Diego A Derek Laughter, DO       TAKE these medications    clotrimazole-betamethasone cream Commonly known as: LOTRISONE Apply 1 Application topically daily as needed (irriation).   Multivitamin Gummies Womens Chew Chew 1 tablet by mouth daily.   traZODone 100 MG tablet Commonly known as: DESYREL Take 100 mg by mouth at bedtime as needed for sleep.         ROS:  Constitutional: negative for chills, fatigue, and fevers Eyes: negative for visual disturbance and pain Ears, nose, mouth, throat, and face: positive for sinus problems, negative for ear drainage and sore throat Respiratory: negative for cough, wheezing, and shortness of breath Cardiovascular: negative for chest pain and palpitations Gastrointestinal: negative for abdominal pain, nausea, reflux symptoms, and vomiting Genitourinary:negative for dysuria and frequency Integument/breast: positive for dryness, negative for rash Hematologic/lymphatic: negative for bleeding and lymphadenopathy Musculoskeletal:positive for back pain and neck pain Neurological: negative for dizziness and tremors Endocrine: positive for temperature intolerance  Blood pressure 119/72, pulse 86, temperature 98 F (36.7 C), temperature source Oral, resp. rate 14, height 5' 7" (1.702 m), weight 176 lb (79.8 kg), SpO2 96 %. Physical Exam Vitals reviewed.  Constitutional:      Appearance: Normal appearance.  HENT:     Head: Normocephalic and atraumatic.  Eyes:     Extraocular   Movements: Extraocular movements intact.     Pupils: Pupils are equal, round, and reactive to light.  Cardiovascular:     Rate and Rhythm: Normal rate and regular rhythm.  Pulmonary:     Effort: Pulmonary effort is normal.     Breath sounds: Normal breath sounds.  Abdominal:     General: There is no distension.     Palpations: Abdomen is soft.     Tenderness: There is no abdominal tenderness.  Genitourinary:    Comments: 3 cm ulcerative  lesion of the anal margin at the 3 o'clock position; no evidence of external hemorrhoids, small internal hemorrhoids on digital rectal exam Musculoskeletal:        General: Normal range of motion.     Cervical back: Normal range of motion and neck supple.  Skin:    General: Skin is warm and dry.  Neurological:     General: No focal deficit present.     Mental Status: She is alert and oriented to person, place, and time.  Psychiatric:        Mood and Affect: Mood normal.        Behavior: Behavior normal.     Results: No results found for this or any previous visit (from the past 48 hour(s)).  No results found.   Assessment & Plan:  Osceola L Adee is a 42 y.o. female who presents for evaluation of a possible hemorrhoid.  -I explained to the patient that given her appearance on exam, this area does not appear to be a hemorrhoid, and is a lesion that will require biopsy, as there is a risk that this is cancer -The risk and benefits of anal exam under anesthesia with biopsy of anal lesion were discussed including but not limited to bleeding, infection, injury to surrounding structures, need for additional procedures.  After careful consideration, Akya L Omeara has decided to proceed with surgery. -Patient tentatively scheduled for surgery on 5/15 -We discussed that further treatment recommendations will be made pending the results of her pathology -Information provided to the patient regarding hemorrhoids  All questions were answered to the satisfaction of the patient.  [name], DO [facility] [address] [place], [place] [address] [phone] (office) 

## 2023-03-29 NOTE — H&P (Signed)
[facility] History and Physical  Reason for Referral: Hemorrhoids Referring Physician: Dr. Carlena Sax  Chief Complaint   New Patient (Initial Visit)     Marie Duncan is a 42 y.o. female.  HPI: Patient presents for evaluation of a possible hemorrhoid.  The area has been present for 3 to 4 months.  It occasionally causes her pain.  She has a history of hemorrhoids after pregnancy.  She has a bowel movement every 2 days and does occasionally need to strain with bowel movements.  She denies using any kind of bowel regimen.  She will occasionally note some blood with her bowel movements, and it is on toilet paper with wiping.  She has never had a colonoscopy.  Her past medical history significant for insomnia.  Her surgical history is significant for tubal ligation.  She denies use of blood thinning medications.  She quit smoking in March of this year.  She will occasionally smoke marijuana and consumes alcohol.  No past medical history on file.  Past Surgical History:  Procedure Laterality Date   TUBAL LIGATION      No family history on file.  Social History   Tobacco Use   Smoking status: Every Day   Smokeless tobacco: Former    Quit date: 01/17/2023  Substance Use Topics   Alcohol use: No   Drug use: No    Medications: I have reviewed the patient's current medications. Allergies as of 03/26/2023       Reactions   Penicillins    unknown        Medication List        Accurate as of Mar 26, 2023 11:59 PM. If you have any questions, ask your nurse or doctor.          STOP taking these medications    amoxicillin 500 MG capsule Commonly known as: AMOXIL Stopped by: Chrishonda Hesch A Bonetta Mostek, DO   chlorhexidine 0.12 % solution Commonly known as: PERIDEX Stopped by: Antanette Richwine A Dhani Dannemiller, DO   HYDROcodone-acetaminophen 5-325 MG tablet Commonly known as: NORCO/VICODIN Stopped by: Gid Schoffstall A Chimamanda Siegfried, DO   ibuprofen 600 MG tablet Commonly known  as: ADVIL Stopped by: Wali Reinheimer A Darothy Courtright, DO       TAKE these medications    clotrimazole-betamethasone cream Commonly known as: LOTRISONE Apply 1 Application topically daily as needed (irriation).   Multivitamin Gummies Womens [employer] 1 tablet by mouth daily.   traZODone 100 MG tablet Commonly known as: DESYREL Take 100 mg by mouth at bedtime as needed for sleep.         ROS:  Constitutional: negative for chills, fatigue, and fevers Eyes: negative for visual disturbance and pain Ears, nose, mouth, throat, and face: positive for sinus problems, negative for ear drainage and sore throat Respiratory: negative for cough, wheezing, and shortness of breath Cardiovascular: negative for chest pain and palpitations Gastrointestinal: negative for abdominal pain, nausea, reflux symptoms, and vomiting Genitourinary:negative for dysuria and frequency Integument/breast: positive for dryness, negative for rash Hematologic/lymphatic: negative for bleeding and lymphadenopathy Musculoskeletal:positive for back pain and neck pain Neurological: negative for dizziness and tremors Endocrine: positive for temperature intolerance  Blood pressure 119/72, pulse 86, temperature 98 F (36.7 C), temperature source Oral, resp. rate 14, height 5\' 7"  (1.702 m), weight 176 lb (79.8 kg), SpO2 96 %. Physical Exam Vitals reviewed.  Constitutional:      Appearance: Normal appearance.  HENT:     Head: Normocephalic and atraumatic.  Eyes:     Extraocular  Movements: Extraocular movements intact.     Pupils: Pupils are equal, round, and reactive to light.  Cardiovascular:     Rate and Rhythm: Normal rate and regular rhythm.  Pulmonary:     Effort: Pulmonary effort is normal.     Breath sounds: Normal breath sounds.  Abdominal:     General: There is no distension.     Palpations: Abdomen is soft.     Tenderness: There is no abdominal tenderness.  Genitourinary:    Comments: 3 cm ulcerative  lesion of the anal margin at the 3 o'clock position; no evidence of external hemorrhoids, small internal hemorrhoids on digital rectal exam Musculoskeletal:        General: Normal range of motion.     Cervical back: Normal range of motion and neck supple.  Skin:    General: Skin is warm and dry.  Neurological:     General: No focal deficit present.     Mental Status: She is alert and oriented to person, place, and time.  Psychiatric:        Mood and Affect: Mood normal.        Behavior: Behavior normal.     Results: No results found for this or any previous visit (from the past 48 hour(s)).  No results found.   Assessment & Plan:  Marie Duncan is a 42 y.o. female who presents for evaluation of a possible hemorrhoid.  -I explained to the patient that given her appearance on exam, this area does not appear to be a hemorrhoid, and is a lesion that will require biopsy, as there is a risk that this is cancer -The risk and benefits of anal exam under anesthesia with biopsy of anal lesion were discussed including but not limited to bleeding, infection, injury to surrounding structures, need for additional procedures.  After careful consideration, Marie Duncan has decided to proceed with surgery. -Patient tentatively scheduled for surgery on 5/15 -We discussed that further treatment recommendations will be made pending the results of her pathology -Information provided to the patient regarding hemorrhoids  All questions were answered to the satisfaction of the patient.  Theophilus Kinds, DO [facility] [address] [place], [place] [address] [phone] (office)

## 2023-03-30 ENCOUNTER — Encounter (HOSPITAL_COMMUNITY)
Admission: RE | Admit: 2023-03-30 | Discharge: 2023-03-30 | Disposition: A | Discharge status: Home / Self Care | Payer: 59 | Source: Ambulatory Visit | Attending: Surgery | Admitting: Surgery

## 2023-03-30 VITALS — BP 119/72 | HR 86 | Resp 18 | Ht 67.0 in | Wt 176.0 lb

## 2023-03-30 DIAGNOSIS — Z01812 Encounter for preprocedural laboratory examination: Secondary | ICD-10-CM | POA: Insufficient documentation

## 2023-03-30 DIAGNOSIS — Z01818 Encounter for other preprocedural examination: Secondary | ICD-10-CM

## 2023-03-30 LAB — POCT PREGNANCY, URINE: Preg Test, Ur: NEGATIVE

## 2023-04-01 ENCOUNTER — Encounter: Payer: Self-pay | Admitting: *Deleted

## 2023-04-01 ENCOUNTER — Ambulatory Visit (HOSPITAL_BASED_OUTPATIENT_CLINIC_OR_DEPARTMENT_OTHER): Payer: 59 | Admitting: Anesthesiology

## 2023-04-01 ENCOUNTER — Ambulatory Visit (HOSPITAL_COMMUNITY): Payer: 59 | Admitting: Anesthesiology

## 2023-04-01 ENCOUNTER — Ambulatory Visit (HOSPITAL_COMMUNITY)
Admission: RE | Admit: 2023-04-01 | Discharge: 2023-04-01 | Disposition: A | Discharge status: Home / Self Care | Payer: 59 | Attending: Surgery | Admitting: Surgery

## 2023-04-01 ENCOUNTER — Encounter (HOSPITAL_COMMUNITY)
Admission: RE | Disposition: A | Discharge status: Home / Self Care | Payer: Self-pay | Source: Home / Self Care | Attending: Surgery

## 2023-04-01 DIAGNOSIS — K629 Disease of anus and rectum, unspecified: Secondary | ICD-10-CM

## 2023-04-01 DIAGNOSIS — K648 Other hemorrhoids: Secondary | ICD-10-CM | POA: Insufficient documentation

## 2023-04-01 DIAGNOSIS — C44520 Squamous cell carcinoma of anal skin: Secondary | ICD-10-CM | POA: Insufficient documentation

## 2023-04-01 DIAGNOSIS — D013 Carcinoma in situ of anus and anal canal: Secondary | ICD-10-CM

## 2023-04-01 DIAGNOSIS — F17210 Nicotine dependence, cigarettes, uncomplicated: Secondary | ICD-10-CM | POA: Diagnosis not present

## 2023-04-01 HISTORY — PX: RECTAL BIOPSY: SHX2303

## 2023-04-01 SURGERY — EXAM UNDER ANESTHESIA
Anesthesia: General | Site: Anus

## 2023-04-01 MED ORDER — MIDAZOLAM HCL 2 MG/2ML IJ SOLN
INTRAMUSCULAR | Status: DC | PRN
  Administered 2023-04-01: 2 mg via INTRAVENOUS

## 2023-04-01 MED ORDER — DEXAMETHASONE SODIUM PHOSPHATE 10 MG/ML IJ SOLN
INTRAMUSCULAR | Status: DC | PRN
  Administered 2023-04-01: 6 mg via INTRAVENOUS

## 2023-04-01 MED ORDER — DOCUSATE SODIUM 100 MG PO CAPS: 100.0000 mg | ORAL_CAPSULE | Freq: Two times a day (BID) | ORAL | 2 refills | Status: DC

## 2023-04-01 MED ORDER — PROPOFOL 500 MG/50ML IV EMUL
INTRAVENOUS | Status: AC
  Filled 2023-04-01: qty 50

## 2023-04-01 MED ORDER — BUPIVACAINE HCL (PF) 0.5 % IJ SOLN
INTRAMUSCULAR | Status: AC
  Filled 2023-04-01: qty 30

## 2023-04-01 MED ORDER — DEXMEDETOMIDINE HCL IN NACL 80 MCG/20ML IV SOLN
INTRAVENOUS | Status: DC | PRN
  Administered 2023-04-01: 12 ug via INTRAVENOUS
  Administered 2023-04-01: 8 ug via INTRAVENOUS

## 2023-04-01 MED ORDER — PROPOFOL 10 MG/ML IV BOLUS
INTRAVENOUS | Status: DC | PRN
  Administered 2023-04-01: 220 mg via INTRAVENOUS

## 2023-04-01 MED ORDER — ONDANSETRON HCL 4 MG/2ML IJ SOLN
INTRAMUSCULAR | Status: DC | PRN
  Administered 2023-04-01: 4 mg via INTRAVENOUS

## 2023-04-01 MED ORDER — FENTANYL CITRATE (PF) 250 MCG/5ML IJ SOLN
INTRAMUSCULAR | Status: DC | PRN
  Administered 2023-04-01 (×4): 25 ug via INTRAVENOUS

## 2023-04-01 MED ORDER — MIDAZOLAM HCL 2 MG/2ML IJ SOLN
INTRAMUSCULAR | Status: AC
  Filled 2023-04-01: qty 2

## 2023-04-01 MED ORDER — PROPOFOL 10 MG/ML IV BOLUS
INTRAVENOUS | Status: AC
  Filled 2023-04-01: qty 20

## 2023-04-01 MED ORDER — CHLORHEXIDINE GLUCONATE 0.12 % MT SOLN
15.0000 mL | Freq: Once | OROMUCOSAL | Status: AC
  Administered 2023-04-01: 15 mL via OROMUCOSAL

## 2023-04-01 MED ORDER — LIDOCAINE HCL (PF) 2 % IJ SOLN
INTRAMUSCULAR | Status: AC
  Filled 2023-04-01: qty 5

## 2023-04-01 MED ORDER — EPHEDRINE 5 MG/ML INJ
INTRAVENOUS | Status: AC
  Filled 2023-04-01: qty 5

## 2023-04-01 MED ORDER — FENTANYL CITRATE (PF) 100 MCG/2ML IJ SOLN
INTRAMUSCULAR | Status: AC
  Filled 2023-04-01: qty 2

## 2023-04-01 MED ORDER — SODIUM CHLORIDE 0.9 % IV SOLN
2.0000 g | INTRAVENOUS | Status: AC
  Administered 2023-04-01: 2 g via INTRAVENOUS
  Filled 2023-04-01: qty 2

## 2023-04-01 MED ORDER — HYDROMORPHONE HCL 1 MG/ML IJ SOLN
INTRAMUSCULAR | Status: DC | PRN
  Administered 2023-04-01 (×2): .5 mg via INTRAVENOUS

## 2023-04-01 MED ORDER — MEPERIDINE HCL 50 MG/ML IJ SOLN: 6.2500 mg | INTRAMUSCULAR | Status: DC | PRN

## 2023-04-01 MED ORDER — AMERICAINE 20 % RE OINT: TOPICAL_OINTMENT | RECTAL | 0 refills | Status: DC | PRN

## 2023-04-01 MED ORDER — ONDANSETRON HCL 4 MG/2ML IJ SOLN: 4.0000 mg | Freq: Once | INTRAMUSCULAR | Status: DC | PRN

## 2023-04-01 MED ORDER — ACETAMINOPHEN 500 MG PO TABS: 1000.0000 mg | ORAL_TABLET | Freq: Four times a day (QID) | ORAL | 0 refills | Status: AC

## 2023-04-01 MED ORDER — EPHEDRINE SULFATE (PRESSORS) 50 MG/ML IJ SOLN
INTRAMUSCULAR | Status: DC | PRN
  Administered 2023-04-01: 10 mg via INTRAVENOUS

## 2023-04-01 MED ORDER — CHLORHEXIDINE GLUCONATE CLOTH 2 % EX PADS: 6.0000 | MEDICATED_PAD | Freq: Once | CUTANEOUS | Status: DC

## 2023-04-01 MED ORDER — 0.9 % SODIUM CHLORIDE (POUR BTL) OPTIME
TOPICAL | Status: DC | PRN
  Administered 2023-04-01: 1000 mL

## 2023-04-01 MED ORDER — LACTATED RINGERS IV SOLN: INTRAVENOUS | Status: DC

## 2023-04-01 MED ORDER — EPHEDRINE SULFATE-NACL 50-0.9 MG/10ML-% IV SOSY
PREFILLED_SYRINGE | INTRAVENOUS | Status: DC | PRN
  Administered 2023-04-01: 5 mg via INTRAVENOUS

## 2023-04-01 MED ORDER — PHENYLEPHRINE 80 MCG/ML (10ML) SYRINGE FOR IV PUSH (FOR BLOOD PRESSURE SUPPORT)
PREFILLED_SYRINGE | INTRAVENOUS | Status: AC
  Filled 2023-04-01: qty 10

## 2023-04-01 MED ORDER — ONDANSETRON HCL 4 MG/2ML IJ SOLN
INTRAMUSCULAR | Status: AC
  Filled 2023-04-01: qty 2

## 2023-04-01 MED ORDER — HYDROMORPHONE HCL 1 MG/ML IJ SOLN: 0.2500 mg | INTRAMUSCULAR | Status: DC | PRN

## 2023-04-01 MED ORDER — LIDOCAINE HCL (CARDIAC) PF 100 MG/5ML IV SOSY
PREFILLED_SYRINGE | INTRAVENOUS | Status: DC | PRN
  Administered 2023-04-01: 80 mg via INTRAVENOUS

## 2023-04-01 MED ORDER — ORAL CARE MOUTH RINSE: 15.0000 mL | Freq: Once | OROMUCOSAL | Status: AC

## 2023-04-01 MED ORDER — HYDROMORPHONE HCL 1 MG/ML IJ SOLN
INTRAMUSCULAR | Status: AC
  Filled 2023-04-01: qty 0.5

## 2023-04-01 MED ORDER — PHENYLEPHRINE 80 MCG/ML (10ML) SYRINGE FOR IV PUSH (FOR BLOOD PRESSURE SUPPORT)
PREFILLED_SYRINGE | INTRAVENOUS | Status: DC | PRN
  Administered 2023-04-01: 80 ug via INTRAVENOUS

## 2023-04-01 MED ORDER — BUPIVACAINE HCL (PF) 0.5 % IJ SOLN
INTRAMUSCULAR | Status: DC | PRN
  Administered 2023-04-01: 30 mL

## 2023-04-01 MED ORDER — DEXAMETHASONE SODIUM PHOSPHATE 10 MG/ML IJ SOLN
INTRAMUSCULAR | Status: AC
  Filled 2023-04-01: qty 1

## 2023-04-01 MED ORDER — OXYCODONE HCL 5 MG PO TABS: 5.0000 mg | ORAL_TABLET | Freq: Four times a day (QID) | ORAL | 0 refills | Status: DC | PRN

## 2023-04-01 SURGICAL SUPPLY — 34 items
BLADE SURG 15 STRL LF DISP TIS (BLADE) IMPLANT
BLADE SURG 15 STRL SS (BLADE) ×1
BNDG CMPR 75X21 PLY HI ABS (MISCELLANEOUS) ×1
BNDG GAUZE ROLL STR 2.25X3YD (GAUZE/BANDAGES/DRESSINGS) IMPLANT
BNDG GZE SM 3X2.25 6 PLY (GAUZE/BANDAGES/DRESSINGS) ×1
CLOTH BEACON ORANGE TIMEOUT ST (SAFETY) ×2 IMPLANT
COUNTER NDL 20CT MAGNET RED (NEEDLE) IMPLANT
COVER LIGHT HANDLE STERIS (MISCELLANEOUS) ×4 IMPLANT
COVER MAYO STAND XLG (MISCELLANEOUS) IMPLANT
COVER TABLE BACK 60X90 (DRAPES) IMPLANT
DRAPE HALF SHEET 40X57 (DRAPES) IMPLANT
ELECT REM PT RETURN 9FT ADLT (ELECTROSURGICAL) ×1
ELECTRODE REM PT RTRN 9FT ADLT (ELECTROSURGICAL) IMPLANT
GAUZE 4X4 16PLY ~~LOC~~+RFID DBL (SPONGE) IMPLANT
GAUZE SPONGE 4X4 12PLY STRL (GAUZE/BANDAGES/DRESSINGS) IMPLANT
GAUZE STRETCH 2X75IN STRL (MISCELLANEOUS) IMPLANT
GLOVE BIO SURGEON STRL SZ7 (GLOVE) IMPLANT
GLOVE BIOGEL PI IND STRL 6.5 (GLOVE) ×2 IMPLANT
GLOVE BIOGEL PI IND STRL 7.0 (GLOVE) ×4 IMPLANT
GLOVE SS BIOGEL STRL SZ 6.5 (GLOVE) IMPLANT
GLOVE SURG SS PI 6.5 STRL IVOR (GLOVE) ×2 IMPLANT
GOWN STRL REUS W/TWL LRG LVL3 (GOWN DISPOSABLE) ×4 IMPLANT
KIT TURNOVER KIT A (KITS) ×2 IMPLANT
MARKER SKIN DUAL TIP RULER LAB (MISCELLANEOUS) IMPLANT
NDL HYPO 25X1 1.5 SAFETY (NEEDLE) IMPLANT
NEEDLE HYPO 25X1 1.5 SAFETY (NEEDLE) ×1 IMPLANT
PAD ARMBOARD 7.5X6 YLW CONV (MISCELLANEOUS) ×2 IMPLANT
PAD TELFA 3X4 1S STER (GAUZE/BANDAGES/DRESSINGS) IMPLANT
PENCIL SMOKE EVACUATOR (MISCELLANEOUS) IMPLANT
SET BASIN LINEN APH (SET/KITS/TRAYS/PACK) IMPLANT
SHEET LAVH (DRAPES) IMPLANT
SURGILUBE 2OZ TUBE FLIPTOP (MISCELLANEOUS) IMPLANT
SYR CONTROL 10ML LL (SYRINGE) IMPLANT
YANKAUER SUCT BULB TIP 10FT TU (MISCELLANEOUS) IMPLANT

## 2023-04-01 NOTE — Progress Notes (Signed)
[facility]  Spoke with the patient's significant other in the consultation room.  I explained that she tolerated the procedure without difficulty.  She will have some pain near her anus related to her biopsy site.  I will call her with the results of her pathology.  I discharged her home with a prescription for narcotic pain medication that they should take as needed for pain.  I also want her taking scheduled Tylenol.  If they take the narcotic pain medication, they should take a stool softener as well.  The patient will follow-up with me in 2 weeks.  All questions were answered to his expressed satisfaction.  Theophilus Kinds, DO [facility] [address] [place], [place] [address] [phone] (office)

## 2023-04-01 NOTE — Interval H&P Note (Signed)
History and Physical Interval Note:  04/01/2023 8:40 AM  Marie Duncan  has presented today for surgery, with the diagnosis of ANAL LESION.  The various methods of treatment have been discussed with the patient and family. After consideration of risks, benefits and other options for treatment, the patient has consented to  Procedure(s): EXAM UNDER ANESTHESIA, ANAL (N/A) as a surgical intervention.  The patient's history has been reviewed, patient examined, no change in status, stable for surgery.  I have reviewed the patient's chart and labs.  Questions were answered to the patient's satisfaction.     Naomee Nowland A Yosiah Jasmin

## 2023-04-01 NOTE — Discharge Instructions (Signed)
Ambulatory Surgery Discharge Instructions  General Anesthesia or Sedation Do not drive or operate heavy machinery for 24 hours.  Do not consume alcohol, tranquilizers, sleeping medications, or any non-prescribed medications for 24 hours. Do not make important decisions or sign any important papers in the next 24 hours. You should have someone with you tonight at home.  Activity  You are advised to go directly home from the hospital.  Restrict your activities and rest for a day.  Resume light activity tomorrow. No heavy lifting over 10 lbs or strenuous exercise.  Fluids and Diet Begin with clear liquids, bouillon, dry toast, soda crackers.  If not nauseated, you may go to a regular diet when you desire.  Greasy and spicy foods are not advised.  Medications  If you have not had a bowel movement in 24 hours, take 2 tablespoons over the counter Milk of mag.             You May resume your blood thinners tomorrow (Aspirin, coumadin, or other).  You are being discharged with prescriptions for Opioid/Narcotic Medications: There are some specific considerations for these medications that you should know. Opioid Meds have risks & benefits. Addiction to these meds is always a concern with prolonged use Take medication only as directed Do not drive while taking narcotic pain medication Do not crush tablets or capsules Do not use a different container than medication was dispensed in Lock the container of medication in a cool, dry place out of reach of children and pets. Opioid medication can cause addiction Do not share with anyone else (this is a felony) Do not store medications for future use. Dispose of them properly.     Disposal:  Find a [employer] household drug take back site near you.  If you can't get to a drug take back site, use the recipe below as a last resort to dispose of expired, unused or unwanted drugs. Disposal  (Do not dispose chemotherapy drugs this way, talk to your  prescribing doctor instead.) Step 1: Mix drugs (do not crush) with dirt, kitty litter, or used coffee grounds and add a small amount of water to dissolve any solid medications. Step 2: Seal drugs in plastic bag. Step 3: Place plastic bag in trash. Step 4: Take prescription container and scratch out personal information, then recycle or throw away.  Operative Site  You have some gauze and mesh panties over your biopsy site.  You may remove the gauze tomorrow. Ok to English as a second language teacher. Keep wound clean and dry. No baths or swimming.   Contact Information: If you have questions or concerns, please call our office, [phone], Monday- Thursday 8AM-5PM and Friday 8AM-12Noon.  If it is after hours or on the weekend, please call [facility] Main Number, [phone], and ask to speak to the surgeon on call for Dr. Robyne Peers at [facility].   SPECIFIC COMPLICATIONS TO WATCH FOR: Inability to urinate Fever over 101? F by mouth Nausea and vomiting lasting longer than 24 hours. Pain not relieved by medication ordered Swelling around the operative site Increased redness, warmth, hardness, around operative area Numbness, tingling, or cold fingers or toes Blood -soaked dressing, (small amounts of oozing may be normal) Increasing and progressive drainage from surgical area or exam site

## 2023-04-01 NOTE — Transfer of Care (Signed)
Immediate Anesthesia Transfer of Care Note  Patient: Marie Duncan  Procedure(s) Performed: Francia Greaves UNDER ANESTHESIA, ANAL; PERIANAL LESION BIOPSY (Anus)  Patient Location: PACU  Anesthesia Type:General  Level of Consciousness: awake, alert , and patient cooperative  Airway & Oxygen Therapy: Patient Spontanous Breathing and Patient connected to nasal cannula oxygen  Post-op Assessment: Report given to RN and Post -op Vital signs reviewed and stable  Post vital signs: Reviewed and stable  Last Vitals:  Vitals Value Taken Time  BP 101/69 04/01/23 1021  Temp 97.6 04/01/23   1021  Pulse 66 04/01/23 1022  Resp 12 04/01/23 1022  SpO2 97 % 04/01/23 1022  Vitals shown include unvalidated device data.  Last Pain:  Vitals:   04/01/23 0749  TempSrc: Oral  PainSc: 0-No pain      Patients Stated Pain Goal: 5 (04/01/23 0749)  Complications: No notable events documented.

## 2023-04-01 NOTE — Anesthesia Preprocedure Evaluation (Signed)
Anesthesia Evaluation  Patient identified by MRN, date of birth, ID band Patient awake    Reviewed: Allergy & Precautions, H&P , NPO status , Patient's Chart, lab work & pertinent test results  Airway Mallampati: II  TM Distance: >3 FB Neck ROM: Full    Dental  (+) Dental Advisory Given, Poor Dentition, Chipped   Pulmonary Current Smoker and Patient abstained from smoking.   Pulmonary exam normal breath sounds clear to auscultation       Cardiovascular negative cardio ROS Normal cardiovascular exam Rhythm:Regular Rate:Normal     Neuro/Psych negative neurological ROS  negative psych ROS   GI/Hepatic negative GI ROS, Neg liver ROS,,,  Endo/Other  negative endocrine ROS    Renal/GU negative Renal ROS  negative genitourinary   Musculoskeletal negative musculoskeletal ROS (+)    Abdominal   Peds negative pediatric ROS (+)  Hematology negative hematology ROS (+)   Anesthesia Other Findings   Reproductive/Obstetrics negative OB ROS                             Anesthesia Physical Anesthesia Plan  ASA: 2  Anesthesia Plan: General   Post-op Pain Management: Dilaudid IV   Induction: Intravenous  PONV Risk Score and Plan: 3 and Ondansetron and Dexamethasone  Airway Management Planned: LMA and Oral ETT  Additional Equipment:   Intra-op Plan:   Post-operative Plan: Extubation in OR  Informed Consent: I have reviewed the patients History and Physical, chart, labs and discussed the procedure including the risks, benefits and alternatives for the proposed anesthesia with the patient or authorized representative who has indicated his/her understanding and acceptance.     Dental advisory given  Plan Discussed with: CRNA and Surgeon  Anesthesia Plan Comments:        Anesthesia Quick Evaluation

## 2023-04-01 NOTE — Anesthesia Postprocedure Evaluation (Signed)
Anesthesia Post Note  Patient: SHERRIDAN TROXELL  Procedure(s) Performed: EXAM UNDER ANESTHESIA, ANAL; PUDENDAL NERVE BLOCK (Anus) PERIANAL LESION BIOPSY (Anus)  Patient location during evaluation: Phase II Anesthesia Type: General Level of consciousness: awake and alert and oriented Pain management: pain level controlled Vital Signs Assessment: post-procedure vital signs reviewed and stable Respiratory status: spontaneous breathing, nonlabored ventilation and respiratory function stable Cardiovascular status: blood pressure returned to baseline and stable Postop Assessment: no apparent nausea or vomiting Anesthetic complications: no  No notable events documented.   Last Vitals:  Vitals:   04/01/23 1045 04/01/23 1053  BP: 105/65 108/67  Pulse: 67 (!) 56  Resp: 14   Temp:  36.6 C  SpO2: 99% 99%    Last Pain:  Vitals:   04/01/23 1053  TempSrc: Axillary  PainSc: 0-No pain                 Altair Appenzeller C Vieno Tarrant

## 2023-04-01 NOTE — Anesthesia Procedure Notes (Signed)
Procedure Name: LMA Insertion Date/Time: 04/01/2023 9:24 AM  Performed by: Oletha Cruel, CRNAPre-anesthesia Checklist: Patient identified, Emergency Drugs available, Suction available, Patient being monitored and Timeout performed Patient Re-evaluated:Patient Re-evaluated prior to induction Oxygen Delivery Method: Circle system utilized Preoxygenation: Pre-oxygenation with 100% oxygen Induction Type: IV induction Ventilation: Mask ventilation without difficulty LMA: LMA inserted LMA Size: 4.0 Tube secured with: Tape Dental Injury: Teeth and Oropharynx as per pre-operative assessment

## 2023-04-01 NOTE — Op Note (Signed)
[facility] Operative Note  04/01/23  Preoperative Diagnosis: Perianal lesion   Postoperative Diagnosis: Same   Procedure(s) Performed: Anal exam under anesthesia, biopsy of perianal lesion; Pudendal nerve block   Surgeon: Theophilus Kinds, DO    Assistants: No qualified resident was available    Anesthesia: LMA   Anesthesiologist: Molli Barrows, MD    Specimens: biopsy of perianal lesion   Estimated Blood Loss: Minimal   Blood Replacement: None    Complications: None   Wound Class: Dirty   Operative Indications: Patient is a 42 year old female who presents for anal exam under anesthesia and biopsy of perianal lesion.  This lesion has been present for 3-4 months.  She has intermittent pain associated with the area.  She is agreeable to surgery.  She understands that further recommendations regarding treatment will be made once pathology returns.  All risks and benefits of performing this procedure were discussed with the patient including pain, infection, bleeding, damage to the surrounding structures, and need for more procedures or surgery. The patient voiced understanding of the procedure, all questions were sought and answered, and consent was obtained.  Findings: 3 x 2.5 cm perianal lesion at the 4-5 o'clock position on a broad-based, short stalk with central ulceration    Procedure: The patient was taken to the operating room and placed supine. LMA anesthesia was induced. Intravenous antibiotics were administered per protocol.  The patient was then positioned in lithotomy.  The anus and perineum were prepared and draped in the usual sterile fashion.  A time-out was completed verifying correct patient, procedure, site, positioning, and implant(s) and/or special equipment prior to beginning this procedure.  Anal speculum was inserted and anoscopy was performed.  There were no abnormal lesions within the anus and distal rectum.  She had some small  internal hemorrhoids noted with no external hemorrhoids.  At the 4 to 5 o'clock position, there was a 3 x 2.5 cm perianal lesion with central ulceration.  This lesion had a broad-based, short stalk.  Photodocumentation obtained.  Given the concerning appearance of the lesion and size, decision was made to perform biopsy rather than full excision of the area.  Along the superior, lateral aspect of the incision, a rim of healthy skin and lesion was sharply excised using Metzenbaum scissors.  An additional biopsy area was performed at the inferior aspect, again obtaining what appeared to be healthy skin and lesion.  This biopsy specimen was sent to pathology for evaluation.  Hemostasis was achieved with electrocautery.  A pudendal nerve block was performed with half percent Marcaine.  The remaining Marcaine was injected around the biopsy sites.  Gauze roll was placed at the patient's anus and mesh panties were applied.  Final inspection revealed acceptable hemostasis. All counts were correct at the end of the case. The patient was awakened from anesthesia without complication.  The patient went to the PACU in stable condition.   Theophilus Kinds, DO  [facility] [address] [place], [place] [address] [phone] (office)

## 2023-04-03 ENCOUNTER — Ambulatory Visit (HOSPITAL_COMMUNITY)
Admission: RE | Admit: 2023-04-03 | Discharge: 2023-04-03 | Disposition: A | Discharge status: Home / Self Care | Payer: 59 | Source: Ambulatory Visit | Attending: Internal Medicine | Admitting: Internal Medicine

## 2023-04-03 DIAGNOSIS — E049 Nontoxic goiter, unspecified: Secondary | ICD-10-CM | POA: Diagnosis present

## 2023-04-03 LAB — SURGICAL PATHOLOGY

## 2023-04-06 ENCOUNTER — Telehealth (INDEPENDENT_AMBULATORY_CARE_PROVIDER_SITE_OTHER): Payer: 59 | Admitting: Surgery

## 2023-04-06 ENCOUNTER — Other Ambulatory Visit: Payer: Self-pay | Admitting: *Deleted

## 2023-04-06 DIAGNOSIS — C210 Malignant neoplasm of anus, unspecified: Secondary | ICD-10-CM

## 2023-04-06 DIAGNOSIS — K629 Disease of anus and rectum, unspecified: Secondary | ICD-10-CM

## 2023-04-06 NOTE — Telephone Encounter (Signed)
[facility]  Called to update the patient regarding her pathology results.  She underwent an anal exam under anesthesia with perianal lesion biopsy on 5/15.  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  I explained that the typical treatment for this is something called Juliane Poot protocol which involves chemotherapy and radiation.  I will send in a referral to oncology, so that she can discuss staging and treatment.  I also explained that I will send in a referral to gynecology, as she should receive an up-to-date Pap smear.  I will also send referral to GI so that she can undergo a full colonoscopy.  I will discuss possible referral to a colorectal surgeon with Dr. Ellin Saba, though I did explain that surgery is not the first-line treatment for this type of cancer.  I discussed with the patient that pending her evaluation by oncology, she may need a port placement, which I would be able to place for her.  She will need to follow-up with oncology prior to [place] scheduling any further procedures.  She will follow-up with me next week to check her biopsy site.  Will plan to keep her out of work for 1 week after her surgery.  All questions were answered to her expressed satisfaction.  Pathology: A. 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 [place], [place] [address] [phone] (office)

## 2023-04-07 ENCOUNTER — Encounter (HOSPITAL_COMMUNITY): Payer: Self-pay | Admitting: Surgery

## 2023-04-07 ENCOUNTER — Other Ambulatory Visit: Payer: Self-pay

## 2023-04-07 DIAGNOSIS — C210 Malignant neoplasm of anus, unspecified: Secondary | ICD-10-CM

## 2023-04-07 NOTE — Progress Notes (Signed)
PET scan order placed per Dr. Katragadda. 

## 2023-04-13 ENCOUNTER — Encounter: Payer: Self-pay | Admitting: Hematology

## 2023-04-13 DIAGNOSIS — C44500 Unspecified malignant neoplasm of anal skin: Secondary | ICD-10-CM | POA: Insufficient documentation

## 2023-04-13 NOTE — Progress Notes (Signed)
[facility] [address], [place] [address]   Clinic Day:  04/14/2023  Referring physician: Lewie Chamber*  Patient Care Team: Elfredia Nevins, MD as PCP - General (Internal Medicine) Doreatha Massed, MD as Medical Oncologist (Medical Oncology) Therese Sarah, RN as Oncology Nurse Navigator (Medical Oncology)   ASSESSMENT & PLAN:   Assessment:  1.  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 - She also fell on tailbone in November 2023 and has been having tailbone bone pain on and off. - She was evaluated by Dr.Pappayliou on 03/26/2023.  Bowels changed every other day.  Examination showed 3 cm ulcerative lesion in the perianal region at the 3 o'clock position.  No evidence of external hemorrhoids.  Small internal hemorrhoids on digital rectal exam. - Biopsy (04/01/2023): Moderately differentiated squamous cell carcinoma  2.  Social/family history: - She lives with family at home.  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  Quit smoking on 01/17/2023.  Smoked 1 pack/day started at age 55. - Mother had breast cancer at age 71.  Maternal grandfather had prostate cancer.  Maternal grandmother had kidney cancer.  Maternal great grandfather had leukemia.  Plan:  1.  Moderately differentiated SCC of the perianal region (T2 N0): - I have discussed the pathology report in detail. - Recommend staging with PET scan. - Recommend CBC, CMP, HIV testing. - She has an appointment to see GYN on 04/24/2023 and GI on 04/16/2023. - Clinically she has T2 well-differentiated disease.  If she does not have any lymph node involvement, she may proceed with local excision with adequate margins.  If she has any positive lymph nodes, will consider chemoradiation therapy with 5-FU/mitomycin plus RT. - Will follow-up after the PET scan.  Orders Placed This  Encounter  Procedures   CBC with Differential    Standing Status:   Future    Number of Occurrences:   1    Standing Expiration Date:   04/13/2024   Comprehensive metabolic panel    Standing Status:   Future    Number of Occurrences:   1    Standing Expiration Date:   04/13/2024   HIV antibody (with reflex)    Standing Status:   Future    Number of Occurrences:   1    Standing Expiration Date:   04/13/2024      I,Katie Daubenspeck,acting as a scribe for Doreatha Massed, MD.,have documented all relevant documentation on the behalf of Doreatha Massed, MD,as directed by  Doreatha Massed, MD while in the presence of Doreatha Massed, MD.   I, Doreatha Massed MD, have reviewed the above documentation for accuracy and completeness, and I agree with the above.   Doreatha Massed, MD   5/28/20245:33 PM  CHIEF COMPLAINT/PURPOSE OF CONSULT:   Diagnosis: anal squamous cell carcinoma   Cancer Staging  No matching staging information was found for the patient.   Prior Therapy: none  Current Therapy:  undergoing work up   HISTORY OF PRESENT ILLNESS:   Oncology History  Primary cancer of perianal skin  04/13/2023 Initial Diagnosis   Primary cancer of perianal skin       Marie Duncan is a 42 y.o. female presenting to clinic today for evaluation of anal cancer at the request of Dr. Robyne Peers.  She was initially referred to Dr. Robyne Peers on 03/26/23 for occasional blood with bowel movements and with wiping. Digital  rectal exam performed in the office that day showed a 3 cm ulcerative lesion of the anal margin at 3 o'clock. She was taken for biopsy of the lesion on 04/01/23. Pathology revealed invasive and in situ moderately differentiated squamous cell carcinoma.  A staging PET scan has been ordered but not scheduled.  Today, she states that she is doing well overall. Her appetite level is at 60%. Her energy level is at 10%.  PAST MEDICAL HISTORY:   Past Medical  History: Past Medical History:  Diagnosis Date   Allergy    Anxiety     Surgical History: Past Surgical History:  Procedure Laterality Date   RECTAL BIOPSY N/A 04/01/2023   Procedure: PERIANAL LESION BIOPSY;  Surgeon: Lewie Chamber, DO;  Location: [facility];  Service: General;  Laterality: N/A;   TUBAL LIGATION      Social History: Social History   Socioeconomic History   Marital status: Legally Separated    Spouse name: Not on file   Number of children: Not on file   Years of education: Not on file   Highest education level: Not on file  Occupational History   Not on file  Tobacco Use   Smoking status: Former   Smokeless tobacco: Former    Quit date: 01/17/2023  Substance and Sexual Activity   Alcohol use: No   Drug use: No   Sexual activity: Not Currently    Birth control/protection: Surgical  Other Topics Concern   Not on file  Social History Narrative   Not on file   Social Determinants of Health   Financial Resource Strain: Not on file  Food Insecurity: No Food Insecurity (04/14/2023)   Hunger Vital Sign    Worried About Running Out of Food in the Last Year: Never true    Ran Out of Food in the Last Year: Never true  Transportation Needs: No Transportation Needs (04/14/2023)   PRAPARE - Administrator, Civil Service (Medical): No    Lack of Transportation (Non-Medical): No  Physical Activity: Not on file  Stress: Not on file  Social Connections: Not on file  Intimate Partner Violence: Not At Risk (04/14/2023)   Humiliation, Afraid, Rape, and Kick questionnaire    Fear of Current or Ex-Partner: No    Emotionally Abused: No    Physically Abused: No    Sexually Abused: No    Family History: Family History  Problem Relation Age of Onset   Cancer Mother    Alcohol abuse Father    Drug abuse Father    Early death Father    Hearing loss Maternal Grandmother    Heart disease Maternal Grandmother     Current Medications:  Current  Outpatient Medications:    benzocaine (AMERICAINE) 20 % rectal ointment, Place rectally every 3 (three) hours as needed for pain., Disp: 28.4 g, Rfl: 0   clotrimazole-betamethasone (LOTRISONE) cream, Apply 1 Application topically daily as needed (irriation)., Disp: , Rfl:    docusate sodium (COLACE) 100 MG capsule, Take 1 capsule (100 mg total) by mouth 2 (two) times daily., Disp: 60 capsule, Rfl: 2   Multiple Vitamins-Minerals (MULTIVITAMIN GUMMIES WOMENS) CHEW, Chew 1 tablet by mouth daily., Disp: , Rfl:    oxyCODONE (ROXICODONE) 5 MG immediate release tablet, Take 1 tablet (5 mg total) by mouth every 6 (six) hours as needed. (Patient not taking: Reported on 04/14/2023), Disp: 10 tablet, Rfl: 0   traZODone (DESYREL) 100 MG tablet, Take 100 mg by mouth at bedtime  as needed for sleep., Disp: , Rfl:    Allergies: Allergies  Allergen Reactions   Penicillins     unknown    REVIEW OF SYSTEMS:   Review of Systems  Constitutional:  Negative for chills, fatigue and fever.  HENT:   Negative for lump/mass, mouth sores, nosebleeds, sore throat and trouble swallowing.   Eyes:  Negative for eye problems.  Respiratory:  Positive for cough and shortness of breath.   Cardiovascular:  Positive for palpitations. Negative for chest pain and leg swelling.  Gastrointestinal:  Negative for abdominal pain, constipation, diarrhea, nausea and vomiting.  Genitourinary:  Negative for bladder incontinence, difficulty urinating, dysuria, frequency, hematuria and nocturia.   Musculoskeletal:  Positive for back pain. Negative for arthralgias, flank pain, myalgias and neck pain.  Skin:  Negative for itching and rash.  Neurological:  Positive for headaches. Negative for dizziness and numbness.  Hematological:  Does not bruise/bleed easily.  Psychiatric/Behavioral:  Positive for sleep disturbance. Negative for depression and suicidal ideas. The patient is nervous/anxious.   All other systems reviewed and are negative.     VITALS:   Blood pressure 119/65, pulse 76, temperature 97.9 F (36.6 C), temperature source Tympanic, resp. rate 18, height 5\' 7"  (1.702 m), weight 184 lb 4.8 oz (83.6 kg), SpO2 99 %.  Wt Readings from Last 3 Encounters:  04/14/23 184 lb (83.5 kg)  04/14/23 184 lb 4.8 oz (83.6 kg)  04/01/23 176 lb (79.8 kg)    Body mass index is 28.87 kg/m.  Performance status (ECOG): 0 - Asymptomatic  PHYSICAL EXAM:   Physical Exam Vitals and nursing note reviewed. Exam conducted with a chaperone present.  Constitutional:      Appearance: Normal appearance.  Cardiovascular:     Rate and Rhythm: Normal rate and regular rhythm.     Pulses: Normal pulses.     Heart sounds: Normal heart sounds.  Pulmonary:     Effort: Pulmonary effort is normal.     Breath sounds: Normal breath sounds.  Abdominal:     Palpations: Abdomen is soft. There is no hepatomegaly, splenomegaly or mass.     Tenderness: There is no abdominal tenderness.  Musculoskeletal:     Right lower leg: No edema.     Left lower leg: No edema.  Lymphadenopathy:     Cervical: No cervical adenopathy.     Right cervical: No superficial, deep or posterior cervical adenopathy.    Left cervical: No superficial, deep or posterior cervical adenopathy.     Upper Body:     Right upper body: No supraclavicular or axillary adenopathy.     Left upper body: No supraclavicular or axillary adenopathy.  Neurological:     General: No focal deficit present.     Mental Status: She is alert and oriented to person, place, and time.  Psychiatric:        Mood and Affect: Mood normal.        Behavior: Behavior normal.     LABS:      Latest Ref Rng & Units 04/14/2023    8:46 AM  CBC  WBC 4.0 - 10.5 K/uL 8.6   Hemoglobin 12.0 - 15.0 g/dL 16.1   Hematocrit 09.6 - 46.0 % 40.9   Platelets 150 - 400 K/uL 314       Latest Ref Rng & Units 04/14/2023    8:46 AM  CMP  Glucose 70 - 99 mg/dL 95   BUN 6 - 20 mg/dL 13   Creatinine 0.45 -  1.00  mg/dL 1.61   Sodium 096 - 045 mmol/L 135   Potassium 3.5 - 5.1 mmol/L 3.9   Chloride 98 - 111 mmol/L 102   CO2 22 - 32 mmol/L 19   Calcium 8.9 - 10.3 mg/dL 9.3   Total Protein 6.5 - 8.1 g/dL 7.5   Total Bilirubin 0.3 - 1.2 mg/dL 0.5   Alkaline Phos 38 - 126 U/L 51   AST 15 - 41 U/L 21   ALT 0 - 44 U/L 26      No results found for: "CEA1", "CEA" / No results found for: "CEA1", "CEA" No results found for: "PSA1" No results found for: "WUJ811" No results found for: "CAN125"  No results found for: "TOTALPROTELP", "ALBUMINELP", "A1GS", "A2GS", "BETS", "BETA2SER", "GAMS", "MSPIKE", "SPEI" No results found for: "TIBC", "FERRITIN", "IRONPCTSAT" No results found for: "LDH"   STUDIES:   US THYROID  Result Date: 04/04/2023 CLINICAL DATA:  Palpable abnormality. EXAM: THYROID ULTRASOUND TECHNIQUE: Ultrasound examination of the thyroid gland and adjacent soft tissues was performed. COMPARISON:  None Available. FINDINGS: Parenchymal Echotexture: Mildly heterogenous Isthmus: 0.5 cm Right lobe: 4.3 x 1.6 x 1.6 cm Left lobe: 4.6 x 1.2 x 1.5 cm _________________________________________________________ Estimated total number of nodules >/= 1 cm: 2 Number of spongiform nodules >/=  2 cm not described below (TR1): 0 Number of mixed cystic and solid nodules >/= 1.5 cm not described below (TR2): 0 _________________________________________________________ Nodule # 1: Approximately 1.1 x 1.1 x 0.8 cm solid isoechoic nodule in the left aspect of the thyroid isthmus. Findings consistent with TI-RADS category 3. Given size (<1.4 cm) and appearance, this nodule does NOT meet TI-RADS criteria for biopsy or dedicated follow-up. Nodule # 2: Small 9 mm isoechoic nodule exophytic from the lower pole of the right gland. TI-RADS category 3. Given size (<1.4 cm) and appearance, this nodule does NOT meet TI-RADS criteria for biopsy or dedicated follow-up. Nodule # 3: Approximately 1.2 cm predominantly solid isoechoic nodule in  the inferior aspect of the left gland. TI-RADS category 3. Given size (<1.4 cm) and appearance, this nodule does NOT meet TI-RADS criteria for biopsy or dedicated follow-up. IMPRESSION: 1. Mildly heterogeneous and enlarged thyroid gland. 2. Several small TI-RADS category 3 nodules are noted but do not meet size criteria to warrant biopsy or dedicated imaging surveillance. No imaging follow-up is recommended. The above is in keeping with the ACR TI-RADS recommendations - J Am Coll Radiol 2017;14:587-595. Electronically Signed   By: Malachy Moan M.D.   On: 04/04/2023 06:55

## 2023-04-14 ENCOUNTER — Inpatient Hospital Stay: Payer: 59

## 2023-04-14 ENCOUNTER — Ambulatory Visit (INDEPENDENT_AMBULATORY_CARE_PROVIDER_SITE_OTHER): Payer: 59 | Admitting: Surgery

## 2023-04-14 ENCOUNTER — Encounter: Payer: Self-pay | Admitting: Surgery

## 2023-04-14 ENCOUNTER — Inpatient Hospital Stay: Payer: 59 | Attending: Hematology | Admitting: Hematology

## 2023-04-14 VITALS — BP 113/77 | HR 73 | Temp 98.1°F | Resp 16 | Ht 67.0 in | Wt 184.0 lb

## 2023-04-14 VITALS — BP 119/65 | HR 76 | Temp 97.9°F | Resp 18 | Ht 67.0 in | Wt 184.3 lb

## 2023-04-14 DIAGNOSIS — F419 Anxiety disorder, unspecified: Secondary | ICD-10-CM | POA: Diagnosis not present

## 2023-04-14 DIAGNOSIS — Z9851 Tubal ligation status: Secondary | ICD-10-CM | POA: Insufficient documentation

## 2023-04-14 DIAGNOSIS — K648 Other hemorrhoids: Secondary | ICD-10-CM | POA: Insufficient documentation

## 2023-04-14 DIAGNOSIS — Z79899 Other long term (current) drug therapy: Secondary | ICD-10-CM | POA: Insufficient documentation

## 2023-04-14 DIAGNOSIS — Z803 Family history of malignant neoplasm of breast: Secondary | ICD-10-CM | POA: Diagnosis not present

## 2023-04-14 DIAGNOSIS — C44500 Unspecified malignant neoplasm of anal skin: Secondary | ICD-10-CM

## 2023-04-14 DIAGNOSIS — C210 Malignant neoplasm of anus, unspecified: Secondary | ICD-10-CM | POA: Insufficient documentation

## 2023-04-14 DIAGNOSIS — Z09 Encounter for follow-up examination after completed treatment for conditions other than malignant neoplasm: Secondary | ICD-10-CM

## 2023-04-14 LAB — COMPREHENSIVE METABOLIC PANEL
ALT: 26 U/L (ref 0–44)
AST: 21 U/L (ref 15–41)
Albumin: 4.2 g/dL (ref 3.5–5.0)
Alkaline Phosphatase: 51 U/L (ref 38–126)
Anion gap: 14 (ref 5–15)
BUN: 13 mg/dL (ref 6–20)
CO2: 19 mmol/L — ABNORMAL LOW (ref 22–32)
Calcium: 9.3 mg/dL (ref 8.9–10.3)
Chloride: 102 mmol/L (ref 98–111)
Creatinine, Ser: 0.81 mg/dL (ref 0.44–1.00)
GFR, Estimated: >60 mL/min (ref >60)
Glucose, Bld: 95 mg/dL (ref 70–99)
Potassium: 3.9 mmol/L (ref 3.5–5.1)
Sodium: 135 mmol/L (ref 135–145)
Total Bilirubin: 0.5 mg/dL (ref 0.3–1.2)
Total Protein: 7.5 g/dL (ref 6.5–8.1)

## 2023-04-14 LAB — CBC WITH DIFFERENTIAL/PLATELET
Abs Immature Granulocytes: 0.04 10*3/uL (ref 0.00–0.07)
Basophils Absolute: 0.1 10*3/uL (ref 0.0–0.1)
Basophils Relative: 1 %
Eosinophils Absolute: 0.2 10*3/uL (ref 0.0–0.5)
Eosinophils Relative: 2 %
HCT: 40.9 % (ref 36.0–46.0)
Hemoglobin: 14.1 g/dL (ref 12.0–15.0)
Immature Granulocytes: 1 %
Lymphocytes Relative: 31 %
Lymphs Abs: 2.6 10*3/uL (ref 0.7–4.0)
MCH: 34.2 pg — ABNORMAL HIGH (ref 26.0–34.0)
MCHC: 34.5 g/dL (ref 30.0–36.0)
MCV: 99.3 fL (ref 80.0–100.0)
Monocytes Absolute: 0.6 10*3/uL (ref 0.1–1.0)
Monocytes Relative: 7 %
Neutro Abs: 5.1 10*3/uL (ref 1.7–7.7)
Neutrophils Relative %: 58 %
Platelets: 314 10*3/uL (ref 150–400)
RBC: 4.12 MIL/uL (ref 3.87–5.11)
RDW: 11.7 % (ref 11.5–15.5)
WBC: 8.6 10*3/uL (ref 4.0–10.5)
nRBC: 0 % (ref 0.0–0.2)

## 2023-04-14 LAB — HIV ANTIBODY (ROUTINE TESTING W REFLEX): HIV Screen 4th Generation wRfx: NONREACTIVE

## 2023-04-14 NOTE — Progress Notes (Signed)
[facility] Note   HPI:  42 y.o. Female presents to clinic for post-op follow-up status post anal exam under anesthesia with perianal lesion biopsy on 5/15.  She states that she has been doing better since the phone call.  She still has some occasional pain to the area, but is otherwise doing okay.  She saw the oncologist today, who is ordered a PET scan.  She is also scheduled to see GI to schedule colonoscopy and GYN to obtain Pap smear.  She has no other complaints at this time.  Review of Systems:  All other review of systems: otherwise negative   Vital Signs:  BP 113/77   Pulse 73   Temp 98.1 F (36.7 C) (Oral)   Resp 16   Ht 5\' 7"  (1.702 m)   Wt 184 lb (83.5 kg)   SpO2 97%   BMI 28.82 kg/m    Physical Exam:  Physical Exam Vitals reviewed.  Constitutional:      Appearance: Normal appearance.  Genitourinary:    Comments: 3 cm ulcerative lesion of the anal margin still present with healing biopsy site Neurological:     Mental Status: She is alert.     Laboratory studies: None  Imaging:  PET scan ordered by oncology  Assessment:  42 y.o. yo Female who presents for follow up status post anal exam under anesthesia with perianal lesion biopsy on 5/15  Plan:  -Patient overall doing well since the procedure.  She still has some intermittent pain, which I explained is going to happen given that she has an ulcerative lesion present -She is scheduled to follow-up with GI and gynecology -PET scan has been ordered by oncology, and further recommendations regarding treatment pending PET scan -Follow up with me as needed  All of the above recommendations were discussed with the patient, and all of patient's questions were answered to her expressed satisfaction.  Theophilus Kinds, DO [facility] [address] [place], [place] [address] [phone] (office)

## 2023-04-14 NOTE — Patient Instructions (Addendum)
[facility]  Discharge Instructions  You were seen and examined today by Dr. Ellin Saba. Dr. Ellin Saba is a medical oncologist, meaning that he specializes in the treatment of cancer diagnoses. Dr. Ellin Saba discussed your past medical history, family history of cancers, and the events that led to you being here today.  You were referred to Dr. Ellin Saba due to abnormal pathology results. The biopsy was taken of a lesion of the area around your anus. It did reveal a type of cancer known as squamous cell carcinoma.  Dr. Ellin Saba has recommended additional labs today as well as a PET scan. A PET scan is a specialized CT scan that scans from your ear lobes to your knee caps and illuminates any sign of cancer in the body. This is done in order to accurately stage the cancer.  Treatment will depend upon the PET scan results. Based on the size of the cancer, it is a Stage II cancer. If there are lymph nodes involved, it would then become a Stage III.  If there is no spread of the cancer to the lymph nodes, Dr. Ellin Saba will refer you to a surgeon for surgical evaluation. If there are lymph nodes involved, treatment involves chemotherapy and radiation therapy.  Follow-up with Dr. Ellin Saba following the PET scan.  Thank you for choosing [facility] to provide your oncology and hematology care.   To afford each patient quality time with our provider, please arrive at least 15 minutes before your scheduled appointment time. You may need to reschedule your appointment if you arrive late (10 or more minutes). Arriving late affects you and other patients whose appointments are after yours.  Also, if you miss three or more appointments without notifying the office, you may be dismissed from the clinic at the provider's discretion.    Again, thank you for choosing [facility]  Our hope is that these requests will decrease the amount of  time that you wait before being seen by our physicians.   If you have a lab appointment with the Cancer Center - please note that after April 8th, all labs will be drawn in the cancer center.  You do not have to check in or register with the main entrance as you have in the past but will complete your check-in at the cancer center.            _____________________________________________________________  Should you have questions after your visit to [facility], please contact our office at [phone] and follow the prompts.  Our office hours are 8:00 a.m. to 4:30 p.m. Monday - Thursday and 8:00 a.m. to 2:30 p.m. Friday.  Please note that voicemails left after 4:00 p.m. may not be returned until the following business day.  We are closed weekends and all major holidays.  You do have access to a nurse 24-7, just call the main number to the clinic [phone] and do not press any options, hold on the line and a nurse will answer the phone.    For prescription refill requests, have your pharmacy contact our office and allow 72 hours.    Masks are no longer required in the cancer centers. If you would like for your care team to wear a mask while they are taking care of you, please let them know. You may have one support person who is at least 43 years old accompany you for your appointments.

## 2023-04-15 ENCOUNTER — Telehealth: Payer: Self-pay | Admitting: *Deleted

## 2023-04-15 ENCOUNTER — Other Ambulatory Visit: Payer: Self-pay | Admitting: *Deleted

## 2023-04-15 ENCOUNTER — Encounter: Payer: Self-pay | Admitting: Gastroenterology

## 2023-04-15 ENCOUNTER — Ambulatory Visit (INDEPENDENT_AMBULATORY_CARE_PROVIDER_SITE_OTHER): Payer: 59 | Admitting: Gastroenterology

## 2023-04-15 ENCOUNTER — Encounter: Payer: Self-pay | Admitting: *Deleted

## 2023-04-15 VITALS — BP 104/70 | HR 76 | Temp 98.0°F | Ht 67.0 in | Wt 182.4 lb

## 2023-04-15 DIAGNOSIS — C210 Malignant neoplasm of anus, unspecified: Secondary | ICD-10-CM | POA: Diagnosis not present

## 2023-04-15 NOTE — Telephone Encounter (Signed)
[employer]  PA: CPT Code GECOL Description: Colonoscopy Case Number: [id number] Review Date: 04/15/2023 3:20:27 PM Expiration Date: N/A Status: This member is not in scope for prior-authorization/notification for the services requested. You can save the case reference ID as validation of your request.

## 2023-04-15 NOTE — Patient Instructions (Addendum)
Colonoscopy to be scheduled. See separate instructions.  Take colace 100mg  twice daily.

## 2023-04-15 NOTE — Progress Notes (Signed)
GI Office Note    Referring Provider: Lewie Chamber* Primary Care Physician:  Elfredia Nevins, MD  Primary Gastroenterologist: Hennie Duos. Marletta Lor, DO    Chief Complaint   Chief Complaint  Patient presents with   Anal Lesion     History of Present Illness   Marie Duncan is a 42 y.o. female presenting today at the request of Dr. Robyne Peers for colonoscopy. 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  Presented to Dr. Robyne Peers earlier this month with concern for large external hemorrhoid. Lesion looked suspicious and she completed biopsy on 5/15. 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 She has been seen by oncology, NM PET scheduled for 04/23/23. Appointment with gyn pending for pap smear. She is here now to schedule colonoscopy to rule out any additional lesions.   She typically has regular BMs. Since her biopsy she has mild constipation after anesthesia and pain medication. She took MOM and had significant abdominal cramping for two days. She had good results. Currently have BM every other day. Takes stool softener sometimes. Currently her perianal pain is back to baseline prior to biopsy. She has some brbpr on toilet tissue at time. No UGI symptoms.  Medications   Current Outpatient Medications  Medication Sig Dispense Refill   docusate sodium (COLACE) 100 MG capsule Take 1 capsule (100 mg total) by mouth 2 (two) times daily. 60 capsule 2   Multiple Vitamins-Minerals (MULTIVITAMIN GUMMIES WOMENS) CHEW Chew 1 tablet by mouth daily.     traZODone (DESYREL) 100 MG tablet Take 100 mg by mouth at bedtime as needed for sleep.     No current facility-administered medications for this visit.    Allergies   Allergies as of 04/15/2023 - Review Complete 04/15/2023  Allergen Reaction Noted   Penicillins  12/17/2019    Past Medical History   Past Medical History:   Diagnosis Date   Allergy    Anxiety     Past Surgical History   Past Surgical History:  Procedure Laterality Date   RECTAL BIOPSY N/A 04/01/2023   Procedure: PERIANAL LESION BIOPSY;  Surgeon: Lewie Chamber, DO;  Location: [facility];  Service: General;  Laterality: N/A;   TUBAL LIGATION      Past Family History   Family History  Problem Relation Age of Onset   Cancer Mother    Alcohol abuse Father    Drug abuse Father    Early death Father    Hearing loss Maternal Grandmother    Heart disease Maternal Grandmother    Kidney cancer Maternal Grandmother    Prostate cancer Maternal Grandfather    Colon cancer Neg Hx     Past Social History   Social History   Socioeconomic History   Marital status: Legally Separated    Spouse name: Not on file   Number of children: Not on file   Years of education: Not on file   Highest education level: Not on file  Occupational History   Not on file  Tobacco Use   Smoking status: Former   Smokeless tobacco: Former    Quit date: 01/17/2023  Substance and Sexual Activity   Alcohol use: No   Drug use: No   Sexual activity: Not Currently    Birth control/protection: Surgical  Other Topics Concern   Not on file  Social History Narrative   Not on file   Social Determinants of Health   Financial  Resource Strain: Not on file  Food Insecurity: No Food Insecurity (04/14/2023)   Hunger Vital Sign    Worried About Running Out of Food in the Last Year: Never true    Ran Out of Food in the Last Year: Never true  Transportation Needs: No Transportation Needs (04/14/2023)   PRAPARE - Administrator, Civil Service (Medical): No    Lack of Transportation (Non-Medical): No  Physical Activity: Not on file  Stress: Not on file  Social Connections: Not on file  Intimate Partner Violence: Not At Risk (04/14/2023)   Humiliation, Afraid, Rape, and Kick questionnaire    Fear of Current or Ex-Partner: No    Emotionally Abused: No     Physically Abused: No    Sexually Abused: No    Review of Systems   General: Negative for anorexia, weight loss, fever, chills, fatigue, weakness. Eyes: Negative for vision changes.  ENT: Negative for hoarseness, difficulty swallowing , nasal congestion. CV: Negative for chest pain, angina, palpitations, dyspnea on exertion, peripheral edema.  Respiratory: Negative for dyspnea at rest, dyspnea on exertion, cough, sputum, wheezing.  GI: See history of present illness. GU:  Negative for dysuria, hematuria, urinary incontinence, urinary frequency, nocturnal urination.  MS: Negative for joint pain, low back pain.  Derm: Negative for rash or itching.  Neuro: Negative for weakness, abnormal sensation, seizure, frequent headaches, memory loss,  confusion.  Psych: Negative for anxiety, depression, suicidal ideation, hallucinations.  Endo: Negative for unusual weight change.  Heme: Negative for bruising or bleeding. Allergy: Negative for rash or hives.  Physical Exam   BP 104/70 (BP Location: Right Arm, Patient Position: Sitting, Cuff Size: Normal)   Pulse 76   Temp 98 F (36.7 C) (Oral)   Ht 5\' 7"  (1.702 m)   Wt 182 lb 6.4 oz (82.7 kg)   LMP 04/01/2023 (Approximate)   SpO2 97%   BMI 28.57 kg/m    General: Well-nourished, well-developed in no acute distress.  Head: Normocephalic, atraumatic.   Eyes: Conjunctiva pink, no icterus. Mouth: Oropharyngeal mucosa moist and pink , no lesions erythema or exudate. Neck: Supple without thyromegaly, masses, or lymphadenopathy.  Lungs: Clear to auscultation bilaterally.  Heart: Regular rate and rhythm, no murmurs rubs or gallops.  Abdomen: Bowel sounds are normal, nontender, nondistended, no hepatosplenomegaly or masses,  no abdominal bruits or hernia, no rebound or guarding.   Rectal: deferred Extremities: No lower extremity edema. No clubbing or deformities.  Neuro: Alert and oriented x 4 , grossly normal neurologically.  Skin: Warm  and dry, no rash or jaundice.   Psych: Alert and cooperative, normal mood and affect.  Labs   Lab Results  Component Value Date   WBC 8.6 04/14/2023   HGB 14.1 04/14/2023   HCT 40.9 04/14/2023   MCV 99.3 04/14/2023   PLT 314 04/14/2023   Lab Results  Component Value Date   CREATININE 0.81 04/14/2023   BUN 13 04/14/2023   NA 135 04/14/2023   K 3.9 04/14/2023   CL 102 04/14/2023   CO2 19 (L) 04/14/2023   Lab Results  Component Value Date   ALT 26 04/14/2023   AST 21 04/14/2023   ALKPHOS 51 04/14/2023   BILITOT 0.5 04/14/2023    Imaging Studies   US THYROID  Result Date: 04/04/2023 CLINICAL DATA:  Palpable abnormality. EXAM: THYROID ULTRASOUND TECHNIQUE: Ultrasound examination of the thyroid gland and adjacent soft tissues was performed. COMPARISON:  None Available. FINDINGS: Parenchymal Echotexture: Mildly heterogenous Isthmus: 0.5 cm  Right lobe: 4.3 x 1.6 x 1.6 cm Left lobe: 4.6 x 1.2 x 1.5 cm _________________________________________________________ Estimated total number of nodules >/= 1 cm: 2 Number of spongiform nodules >/=  2 cm not described below (TR1): 0 Number of mixed cystic and solid nodules >/= 1.5 cm not described below (TR2): 0 _________________________________________________________ Nodule # 1: Approximately 1.1 x 1.1 x 0.8 cm solid isoechoic nodule in the left aspect of the thyroid isthmus. Findings consistent with TI-RADS category 3. Given size (<1.4 cm) and appearance, this nodule does NOT meet TI-RADS criteria for biopsy or dedicated follow-up. Nodule # 2: Small 9 mm isoechoic nodule exophytic from the lower pole of the right gland. TI-RADS category 3. Given size (<1.4 cm) and appearance, this nodule does NOT meet TI-RADS criteria for biopsy or dedicated follow-up. Nodule # 3: Approximately 1.2 cm predominantly solid isoechoic nodule in the inferior aspect of the left gland. TI-RADS category 3. Given size (<1.4 cm) and appearance, this nodule does NOT meet TI-RADS  criteria for biopsy or dedicated follow-up. IMPRESSION: 1. Mildly heterogeneous and enlarged thyroid gland. 2. Several small TI-RADS category 3 nodules are noted but do not meet size criteria to warrant biopsy or dedicated imaging surveillance. No imaging follow-up is recommended. The above is in keeping with the ACR TI-RADS recommendations - J Am Coll Radiol 2017;14:587-595. Electronically Signed   By: Malachy Moan M.D.   On: 04/04/2023 06:55    Assessment   Anal cancer: need colonoscopy to rule out other lesions. Has had some mild constipation postoperatively.    PLAN   Colonoscopy in near future. ASA 2.  I have discussed the risks, alternatives, benefits with regards to but not limited to the risk of reaction to medication, bleeding, infection, perforation and the patient is agreeable to proceed. Written consent to be obtained. Take colace 100mg  twice daily.    Leanna Battles. Melvyn Neth, MHS, PA-C [facility]

## 2023-04-15 NOTE — H&P (View-Only) (Signed)
   GI Office Note    Referring Provider: Pappayliou, Catherine A* Primary Care Physician:  Fusco, Lawrence, MD  Primary Gastroenterologist: Charles K. Carver, DO    Chief Complaint   Chief Complaint  Patient presents with   Anal Lesion     History of Present Illness   Marie Duncan is a 42 y.o. female presenting today at the request of Dr. Pappayliou for colonoscopy. 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  Presented to Dr. Pappayliou earlier this month with concern for large external hemorrhoid. Lesion looked suspicious and she completed biopsy on 5/15. 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 She has been seen by oncology, NM PET scheduled for 04/23/23. Appointment with gyn pending for pap smear. She is here now to schedule colonoscopy to rule out any additional lesions.   She typically has regular BMs. Since her biopsy she has mild constipation after anesthesia and pain medication. She took MOM and had significant abdominal cramping for two days. She had good results. Currently have BM every other day. Takes stool softener sometimes. Currently her perianal pain is back to baseline prior to biopsy. She has some brbpr on toilet tissue at time. No UGI symptoms.  Medications   Current Outpatient Medications  Medication Sig Dispense Refill   docusate sodium (COLACE) 100 MG capsule Take 1 capsule (100 mg total) by mouth 2 (two) times daily. 60 capsule 2   Multiple Vitamins-Minerals (MULTIVITAMIN GUMMIES WOMENS) CHEW Chew 1 tablet by mouth daily.     traZODone (DESYREL) 100 MG tablet Take 100 mg by mouth at bedtime as needed for sleep.     No current facility-administered medications for this visit.    Allergies   Allergies as of 04/15/2023 - Review Complete 04/15/2023  Allergen Reaction Noted   Penicillins  12/17/2019    Past Medical History   Past Medical History:   Diagnosis Date   Allergy    Anxiety     Past Surgical History   Past Surgical History:  Procedure Laterality Date   RECTAL BIOPSY N/A 04/01/2023   Procedure: PERIANAL LESION BIOPSY;  Surgeon: Pappayliou, Catherine A, DO;  Location: [facility];  Service: General;  Laterality: N/A;   TUBAL LIGATION      Past Family History   Family History  Problem Relation Age of Onset   Cancer Mother    Alcohol abuse Father    Drug abuse Father    Early death Father    Hearing loss Maternal Grandmother    Heart disease Maternal Grandmother    Kidney cancer Maternal Grandmother    Prostate cancer Maternal Grandfather    Colon cancer Neg Hx     Past Social History   Social History   Socioeconomic History   Marital status: Legally Separated    Spouse name: Not on file   Number of children: Not on file   Years of education: Not on file   Highest education level: Not on file  Occupational History   Not on file  Tobacco Use   Smoking status: Former   Smokeless tobacco: Former    Quit date: 01/17/2023  Substance and Sexual Activity   Alcohol use: No   Drug use: No   Sexual activity: Not Currently    Birth control/protection: Surgical  Other Topics Concern   Not on file  Social History Narrative   Not on file   Social Determinants of Health   Financial   Resource Strain: Not on file  Food Insecurity: No Food Insecurity (04/14/2023)   Hunger Vital Sign    Worried About Running Out of Food in the Last Year: Never true    Ran Out of Food in the Last Year: Never true  Transportation Needs: No Transportation Needs (04/14/2023)   PRAPARE - Transportation    Lack of Transportation (Medical): No    Lack of Transportation (Non-Medical): No  Physical Activity: Not on file  Stress: Not on file  Social Connections: Not on file  Intimate Partner Violence: Not At Risk (04/14/2023)   Humiliation, Afraid, Rape, and Kick questionnaire    Fear of Current or Ex-Partner: No    Emotionally Abused: No     Physically Abused: No    Sexually Abused: No    Review of Systems   General: Negative for anorexia, weight loss, fever, chills, fatigue, weakness. Eyes: Negative for vision changes.  ENT: Negative for hoarseness, difficulty swallowing , nasal congestion. CV: Negative for chest pain, angina, palpitations, dyspnea on exertion, peripheral edema.  Respiratory: Negative for dyspnea at rest, dyspnea on exertion, cough, sputum, wheezing.  GI: See history of present illness. GU:  Negative for dysuria, hematuria, urinary incontinence, urinary frequency, nocturnal urination.  MS: Negative for joint pain, low back pain.  Derm: Negative for rash or itching.  Neuro: Negative for weakness, abnormal sensation, seizure, frequent headaches, memory loss,  confusion.  Psych: Negative for anxiety, depression, suicidal ideation, hallucinations.  Endo: Negative for unusual weight change.  Heme: Negative for bruising or bleeding. Allergy: Negative for rash or hives.  Physical Exam   BP 104/70 (BP Location: Right Arm, Patient Position: Sitting, Cuff Size: Normal)   Pulse 76   Temp 98 F (36.7 C) (Oral)   Ht 5' 7" (1.702 m)   Wt 182 lb 6.4 oz (82.7 kg)   LMP 04/01/2023 (Approximate)   SpO2 97%   BMI 28.57 kg/m    General: Well-nourished, well-developed in no acute distress.  Head: Normocephalic, atraumatic.   Eyes: Conjunctiva pink, no icterus. Mouth: Oropharyngeal mucosa moist and pink , no lesions erythema or exudate. Neck: Supple without thyromegaly, masses, or lymphadenopathy.  Lungs: Clear to auscultation bilaterally.  Heart: Regular rate and rhythm, no murmurs rubs or gallops.  Abdomen: Bowel sounds are normal, nontender, nondistended, no hepatosplenomegaly or masses,  no abdominal bruits or hernia, no rebound or guarding.   Rectal: deferred Extremities: No lower extremity edema. No clubbing or deformities.  Neuro: Alert and oriented x 4 , grossly normal neurologically.  Skin: Warm  and dry, no rash or jaundice.   Psych: Alert and cooperative, normal mood and affect.  Labs   Lab Results  Component Value Date   WBC 8.6 04/14/2023   HGB 14.1 04/14/2023   HCT 40.9 04/14/2023   MCV 99.3 04/14/2023   PLT 314 04/14/2023   Lab Results  Component Value Date   CREATININE 0.81 04/14/2023   BUN 13 04/14/2023   NA 135 04/14/2023   K 3.9 04/14/2023   CL 102 04/14/2023   CO2 19 (L) 04/14/2023   Lab Results  Component Value Date   ALT 26 04/14/2023   AST 21 04/14/2023   ALKPHOS 51 04/14/2023   BILITOT 0.5 04/14/2023    Imaging Studies   US THYROID  Result Date: 04/04/2023 CLINICAL DATA:  Palpable abnormality. EXAM: THYROID ULTRASOUND TECHNIQUE: Ultrasound examination of the thyroid gland and adjacent soft tissues was performed. COMPARISON:  None Available. FINDINGS: Parenchymal Echotexture: Mildly heterogenous Isthmus: 0.5 cm   Right lobe: 4.3 x 1.6 x 1.6 cm Left lobe: 4.6 x 1.2 x 1.5 cm _________________________________________________________ Estimated total number of nodules >/= 1 cm: 2 Number of spongiform nodules >/=  2 cm not described below (TR1): 0 Number of mixed cystic and solid nodules >/= 1.5 cm not described below (TR2): 0 _________________________________________________________ Nodule # 1: Approximately 1.1 x 1.1 x 0.8 cm solid isoechoic nodule in the left aspect of the thyroid isthmus. Findings consistent with TI-RADS category 3. Given size (<1.4 cm) and appearance, this nodule does NOT meet TI-RADS criteria for biopsy or dedicated follow-up. Nodule # 2: Small 9 mm isoechoic nodule exophytic from the lower pole of the right gland. TI-RADS category 3. Given size (<1.4 cm) and appearance, this nodule does NOT meet TI-RADS criteria for biopsy or dedicated follow-up. Nodule # 3: Approximately 1.2 cm predominantly solid isoechoic nodule in the inferior aspect of the left gland. TI-RADS category 3. Given size (<1.4 cm) and appearance, this nodule does NOT meet TI-RADS  criteria for biopsy or dedicated follow-up. IMPRESSION: 1. Mildly heterogeneous and enlarged thyroid gland. 2. Several small TI-RADS category 3 nodules are noted but do not meet size criteria to warrant biopsy or dedicated imaging surveillance. No imaging follow-up is recommended. The above is in keeping with the ACR TI-RADS recommendations - J Am Coll Radiol 2017;14:587-595. Electronically Signed   By: Heath  McCullough M.D.   On: 04/04/2023 06:55    Assessment   Anal cancer: need colonoscopy to rule out other lesions. Has had some mild constipation postoperatively.    PLAN   Colonoscopy in near future. ASA 2.  I have discussed the risks, alternatives, benefits with regards to but not limited to the risk of reaction to medication, bleeding, infection, perforation and the patient is agreeable to proceed. Written consent to be obtained. Take colace 100mg twice daily.    Breaunna Gottlieb S. Arvilla Salada, MHS, PA-C [facility]  

## 2023-04-16 ENCOUNTER — Encounter: Payer: Self-pay | Admitting: Gastroenterology

## 2023-04-21 ENCOUNTER — Other Ambulatory Visit (HOSPITAL_COMMUNITY)
Admission: RE | Admit: 2023-04-21 | Discharge: 2023-04-21 | Disposition: A | Discharge status: Home / Self Care | Payer: 59 | Source: Ambulatory Visit | Attending: Internal Medicine | Admitting: Internal Medicine

## 2023-04-21 ENCOUNTER — Encounter: Payer: Self-pay | Admitting: *Deleted

## 2023-04-21 DIAGNOSIS — C210 Malignant neoplasm of anus, unspecified: Secondary | ICD-10-CM | POA: Diagnosis present

## 2023-04-21 LAB — PREGNANCY, URINE: Preg Test, Ur: NEGATIVE

## 2023-04-21 NOTE — Progress Notes (Signed)
FMLA documents faxed to the [facility] @ [phone]

## 2023-04-22 ENCOUNTER — Other Ambulatory Visit: Payer: Self-pay

## 2023-04-22 ENCOUNTER — Ambulatory Visit (HOSPITAL_COMMUNITY)
Admission: RE | Admit: 2023-04-22 | Discharge: 2023-04-22 | Disposition: A | Discharge status: Home / Self Care | Payer: 59 | Attending: Internal Medicine | Admitting: Internal Medicine

## 2023-04-22 ENCOUNTER — Encounter (HOSPITAL_COMMUNITY): Payer: Self-pay

## 2023-04-22 ENCOUNTER — Encounter (HOSPITAL_COMMUNITY)
Admission: RE | Disposition: A | Discharge status: Home / Self Care | Payer: Self-pay | Source: Home / Self Care | Attending: Internal Medicine

## 2023-04-22 ENCOUNTER — Ambulatory Visit (HOSPITAL_COMMUNITY): Payer: 59 | Admitting: Anesthesiology

## 2023-04-22 ENCOUNTER — Ambulatory Visit (HOSPITAL_BASED_OUTPATIENT_CLINIC_OR_DEPARTMENT_OTHER): Payer: 59 | Admitting: Anesthesiology

## 2023-04-22 DIAGNOSIS — C210 Malignant neoplasm of anus, unspecified: Secondary | ICD-10-CM | POA: Diagnosis present

## 2023-04-22 DIAGNOSIS — Z87891 Personal history of nicotine dependence: Secondary | ICD-10-CM | POA: Diagnosis not present

## 2023-04-22 DIAGNOSIS — K648 Other hemorrhoids: Secondary | ICD-10-CM

## 2023-04-22 DIAGNOSIS — K629 Disease of anus and rectum, unspecified: Secondary | ICD-10-CM | POA: Diagnosis not present

## 2023-04-22 DIAGNOSIS — D124 Benign neoplasm of descending colon: Secondary | ICD-10-CM | POA: Insufficient documentation

## 2023-04-22 DIAGNOSIS — F419 Anxiety disorder, unspecified: Secondary | ICD-10-CM | POA: Diagnosis not present

## 2023-04-22 DIAGNOSIS — D126 Benign neoplasm of colon, unspecified: Secondary | ICD-10-CM

## 2023-04-22 HISTORY — PX: POLYPECTOMY: SHX5525

## 2023-04-22 HISTORY — PX: COLONOSCOPY WITH PROPOFOL: SHX5780

## 2023-04-22 SURGERY — COLONOSCOPY WITH PROPOFOL
Anesthesia: General

## 2023-04-22 MED ORDER — PROPOFOL 10 MG/ML IV BOLUS
INTRAVENOUS | Status: DC | PRN
  Administered 2023-04-22: 20 mg via INTRAVENOUS
  Administered 2023-04-22: 50 mg via INTRAVENOUS
  Administered 2023-04-22: 100 mg via INTRAVENOUS

## 2023-04-22 MED ORDER — LIDOCAINE HCL 1 % IJ SOLN
INTRAMUSCULAR | Status: DC | PRN
  Administered 2023-04-22: 50 mg via INTRADERMAL

## 2023-04-22 MED ORDER — PROPOFOL 500 MG/50ML IV EMUL
INTRAVENOUS | Status: DC | PRN
  Administered 2023-04-22: 150 ug/kg/min via INTRAVENOUS

## 2023-04-22 MED ORDER — LACTATED RINGERS IV SOLN: INTRAVENOUS | Status: DC

## 2023-04-22 NOTE — Transfer of Care (Signed)
Immediate Anesthesia Transfer of Care Note  Patient: Marie Duncan  Procedure(s) Performed: COLONOSCOPY WITH PROPOFOL POLYPECTOMY  Patient Location: PACU  Anesthesia Type:General  Level of Consciousness: awake  Airway & Oxygen Therapy: Patient Spontanous Breathing  Post-op Assessment: Report given to RN  Post vital signs: Reviewed  Last Vitals:  Vitals Value Taken Time  BP    Temp    Pulse    Resp    SpO2      Last Pain:  Vitals:   04/22/23 1209  TempSrc:   PainSc: 7       Patients Stated Pain Goal: 5 (04/22/23 1148)  Complications: No notable events documented.

## 2023-04-22 NOTE — Anesthesia Postprocedure Evaluation (Signed)
Anesthesia Post Note  Patient: Marie Duncan  Procedure(s) Performed: COLONOSCOPY WITH PROPOFOL POLYPECTOMY  Patient location during evaluation: Endoscopy Anesthesia Type: General Level of consciousness: awake and alert Pain management: pain level controlled Vital Signs Assessment: post-procedure vital signs reviewed and stable Respiratory status: spontaneous breathing Cardiovascular status: blood pressure returned to baseline and stable Postop Assessment: no apparent nausea or vomiting Anesthetic complications: no   No notable events documented.   Last Vitals:  Vitals:   04/22/23 1156  BP: 129/88  Pulse: 73  Resp: 17  Temp: 36.7 C  SpO2: 99%    Last Pain:  Vitals:   04/22/23 1209  TempSrc:   PainSc: 7                  Brailey Buescher

## 2023-04-22 NOTE — Anesthesia Preprocedure Evaluation (Signed)
Anesthesia Evaluation  Patient identified by MRN, date of birth, ID band Patient awake    Reviewed: Allergy & Precautions, H&P , NPO status , Patient's Chart, lab work & pertinent test results, reviewed documented beta blocker date and time   Airway Mallampati: II  TM Distance: >3 FB Neck ROM: full    Dental no notable dental hx.    Pulmonary neg pulmonary ROS, former smoker   Pulmonary exam normal breath sounds clear to auscultation       Cardiovascular Exercise Tolerance: Good negative cardio ROS  Rhythm:regular Rate:Normal     Neuro/Psych   Anxiety     negative neurological ROS  negative psych ROS   GI/Hepatic negative GI ROS, Neg liver ROS,,,  Endo/Other  negative endocrine ROS    Renal/GU negative Renal ROS  negative genitourinary   Musculoskeletal   Abdominal   Peds  Hematology negative hematology ROS (+)   Anesthesia Other Findings   Reproductive/Obstetrics negative OB ROS                             Anesthesia Physical Anesthesia Plan  ASA: 2  Anesthesia Plan: General   Post-op Pain Management:    Induction:   PONV Risk Score and Plan: Propofol infusion  Airway Management Planned:   Additional Equipment:   Intra-op Plan:   Post-operative Plan:   Informed Consent: I have reviewed the patients History and Physical, chart, labs and discussed the procedure including the risks, benefits and alternatives for the proposed anesthesia with the patient or authorized representative who has indicated his/her understanding and acceptance.     Dental Advisory Given  Plan Discussed with: CRNA  Anesthesia Plan Comments:        Anesthesia Quick Evaluation

## 2023-04-22 NOTE — Interval H&P Note (Signed)
History and Physical Interval Note:  04/22/2023 11:44 AM  Marie Duncan  has presented today for surgery, with the diagnosis of anal cancer.  The various methods of treatment have been discussed with the patient and family. After consideration of risks, benefits and other options for treatment, the patient has consented to  Procedure(s) with comments: COLONOSCOPY WITH PROPOFOL (N/A) - 1:15 pm, asa 2 as a surgical intervention.  The patient's history has been reviewed, patient examined, no change in status, stable for surgery.  I have reviewed the patient's chart and labs.  Questions were answered to the patient's satisfaction.     Lanelle Bal

## 2023-04-22 NOTE — Discharge Instructions (Addendum)
  Colonoscopy Discharge Instructions  Read the instructions outlined below and refer to this sheet in the next few weeks. These discharge instructions provide you with general information on caring for yourself after you leave the hospital. Your doctor may also give you specific instructions. While your treatment has been planned according to the most current medical practices available, unavoidable complications occasionally occur.   ACTIVITY You may resume your regular activity, but move at a slower pace for the next 24 hours.  Take frequent rest periods for the next 24 hours.  Walking will help get rid of the air and reduce the bloated feeling in your belly (abdomen).  No driving for 24 hours (because of the medicine (anesthesia) used during the test).   Do not sign any important legal documents or operate any machinery for 24 hours (because of the anesthesia used during the test).  NUTRITION Drink plenty of fluids.  You may resume your normal diet as instructed by your doctor.  Begin with a light meal and progress to your normal diet. Heavy or fried foods are harder to digest and may make you feel sick to your stomach (nauseated).  Avoid alcoholic beverages for 24 hours or as instructed.  MEDICATIONS You may resume your normal medications unless your doctor tells you otherwise.  WHAT YOU CAN EXPECT TODAY Some feelings of bloating in the abdomen.  Passage of more gas than usual.  Spotting of blood in your stool or on the toilet paper.  IF YOU HAD POLYPS REMOVED DURING THE COLONOSCOPY: No aspirin products for 7 days or as instructed.  No alcohol for 7 days or as instructed.  Eat a soft diet for the next 24 hours.  FINDING OUT THE RESULTS OF YOUR TEST Not all test results are available during your visit. If your test results are not back during the visit, make an appointment with your caregiver to find out the results. Do not assume everything is normal if you have not heard from your  caregiver or the medical facility. It is important for you to follow up on all of your test results.  SEEK IMMEDIATE MEDICAL ATTENTION IF: You have more than a spotting of blood in your stool.  Your belly is swollen (abdominal distention).  You are nauseated or vomiting.  You have a temperature over 101.  You have abdominal pain or discomfort that is severe or gets worse throughout the day.   Your colonoscopy revealed 1 polyp(s) which I removed successfully. Await pathology results, my office will contact you. I recommend repeating colonoscopy in 5 years for surveillance purposes. Otherwise follow up with GI as needed.   HAPPY BIRTHDAY  Hennie Duos. Marletta Lor, D.O. Gastroenterology and Hepatology [facility]

## 2023-04-22 NOTE — Op Note (Signed)
[facility] Patient Name: Marie Duncan Procedure Date: 04/22/2023 11:51 AM MRN: [id number] Date of Birth: [birth year] Attending MD: Hennie Duos. Marletta Lor , [place], [id number] CSN: [id number] Age: 42 Admit Type: Outpatient Procedure:                Colonoscopy Indications:              Anal Cancer Providers:                Hennie Duos. Marletta Lor, DO, Buel Ream. Museum/gallery exhibitions officer, Charity fundraiser,                            Judeth Cornfield. Jessee Avers, Technician Referring MD:              Medicines:                See the Anesthesia note for documentation of the                            administered medications Complications:            No immediate complications. Estimated Blood Loss:     Estimated blood loss was minimal. Procedure:                Pre-Anesthesia Assessment:                           - The anesthesia plan was to use monitored                            anesthesia care (MAC).                           After obtaining informed consent, the colonoscope                            was passed under direct vision. Throughout the                            procedure, the patient's blood pressure, pulse, and                            oxygen saturations were monitored continuously. The                            [id number]-HQ190L ([id number]) scope was introduced through                            the anus and advanced to the the cecum, identified                            by appendiceal orifice and ileocecal valve. The                            colonoscopy was performed without difficulty. The                            patient tolerated the procedure  well. The quality                            of the bowel preparation was evaluated using the                            BBPS [facility] Bowel Preparation Scale) with scores                            of: Right Colon = 3, Transverse Colon = 3 and Left                            Colon = 3 (entire mucosa seen well with no residual                            staining, small  fragments of stool or opaque                            liquid). The total BBPS score equals 9. 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      A 5 mm polyp was found in the descending colon. The polyp was sessile.       The polyp was removed with a cold snare. Resection and retrieval were       complete.      Non-bleeding internal hemorrhoids were found during endoscopy.      The exam was otherwise without abnormality. Impression:               - 3 cm lesion consistent with known SCC found on                            perianal exam.                           - One 5 mm polyp in the descending colon, removed                            with a cold snare. Resected and retrieved.                           - Non-bleeding internal hemorrhoids.                           - The examination was otherwise normal. Moderate Sedation:      Per Anesthesia Care Recommendation:           - Patient has a contact number available for                            emergencies. The signs and symptoms of potential  delayed complications were discussed with the                            patient. Return to normal activities tomorrow.                            Written discharge instructions were provided to the                            patient.                           - Resume previous diet.                           - Continue present medications.                           - Await pathology results.                           - Repeat colonoscopy in 5 years for surveillance.                           - Return to GI clinic PRN. Procedure Code(s):        --- Professional ---                           [id number], Colonoscopy, flexible; with removal of                             tumor(s), polyp(s), or other lesion(s) by snare                            technique Diagnosis Code(s):        --- Professional ---                           D12.4, Benign neoplasm of descending colon CPT copyright 2022 American Medical Association. All rights reserved. The codes documented in this report are preliminary and upon coder review may  be revised to meet current compliance requirements. Hennie Duos. Marletta Lor, DO Hennie Duos. Marletta Lor, DO 04/22/2023 12:34:57 PM This report has been signed electronically. Number of Addenda: 0

## 2023-04-23 ENCOUNTER — Encounter (HOSPITAL_COMMUNITY): Payer: 59

## 2023-04-23 LAB — SURGICAL PATHOLOGY

## 2023-04-24 ENCOUNTER — Encounter: Payer: Self-pay | Admitting: Obstetrics & Gynecology

## 2023-04-24 ENCOUNTER — Other Ambulatory Visit: Payer: Self-pay | Admitting: Obstetrics & Gynecology

## 2023-04-24 ENCOUNTER — Ambulatory Visit (INDEPENDENT_AMBULATORY_CARE_PROVIDER_SITE_OTHER): Payer: 59 | Admitting: Obstetrics & Gynecology

## 2023-04-24 VITALS — BP 140/79 | HR 70 | Ht 67.0 in | Wt 183.0 lb

## 2023-04-24 DIAGNOSIS — Z01419 Encounter for gynecological examination (general) (routine) without abnormal findings: Secondary | ICD-10-CM | POA: Diagnosis not present

## 2023-04-24 DIAGNOSIS — Z1231 Encounter for screening mammogram for malignant neoplasm of breast: Secondary | ICD-10-CM | POA: Diagnosis not present

## 2023-04-24 NOTE — Patient Instructions (Signed)
Please schedule a mammogram at one of the following locations:  [place]: [phone]  [facility] [place]:[phone] [address]  

## 2023-04-24 NOTE — Progress Notes (Signed)
   WELL-WOMAN EXAMINATION Patient name: Marie Duncan MRN [id number]  Date of birth: Apr 01, 1981 Chief Complaint:   Annual Exam  History of Present Illness:   Marie Duncan is a 42 y.o.G3P3 female being seen today for a routine well-woman exam. Pt recently diagnosed with anal SCC  Menses are monthly- typically 4-5 days.  Some dysmenorrhea- taking tylenol.  NSVD x3  Patient's last menstrual period was 04/01/2023 (approximate).  The current method of family planning is tubal ligation   Last pap due today.  Last mammogram: pt due      04/24/2023    8:42 AM 04/14/2023    8:28 AM  Depression screen PHQ 2/9  Decreased Interest 1 1  Down, Depressed, Hopeless 0 1  PHQ - 2 Score 1 2  Altered sleeping 1 0  Tired, decreased energy 1 1  Change in appetite 1 1  Feeling bad or failure about yourself  0 0  Trouble concentrating 0 1  Moving slowly or fidgety/restless 0 0  Suicidal thoughts 0 0  PHQ-9 Score 4 5      Review of Systems:   Pertinent items are noted in HPI Denies any headaches, blurred vision, fatigue, shortness of breath, chest pain, abdominal pain, bowel movements, urination, or intercourse unless otherwise stated above.  Pertinent History Reviewed:  Reviewed past medical,surgical, social and family history.  Reviewed problem list, medications and allergies. Physical Assessment:   Vitals:   04/24/23 0838 04/24/23 0845  BP: (!) 141/92 (!) 140/79  Pulse: 70   Weight: 183 lb (83 kg)   Height: 5\' 7"  (1.702 m)   Body mass index is 28.66 kg/m.        Physical Examination:   General appearance - well appearing, and in no distress  Mental status - alert, oriented to person, place, and time  Psych:  She has a normal mood and affect  Skin - warm and dry, normal color, no suspicious lesions noted  Chest - effort normal, all lung fields clear to auscultation bilaterally  Heart - normal rate and regular rhythm  Neck:  midline trachea, no thyromegaly or  nodules  Breasts - breasts appear normal, no suspicious masses, no skin or nipple changes or  axillary nodes  Abdomen - soft, nontender, nondistended, no masses or organomegaly  Pelvic - VULVA: normal appearing vulva with no masses, tenderness or lesions  VAGINA: normal appearing vagina with normal color and discharge, no lesions  Wide speculum used- CERVIX: normal appearing cervix without discharge or lesions, no CMT  Thin prep pap is done with HR HPV cotesting  UTERUS: uterus is felt to be normal size, shape, consistency and nontender   ADNEXA: No adnexal masses or tenderness noted.   Extremities:  No swelling or varicosities noted  Chaperone:  Dr. Quincy Simmonds      Assessment & Plan:  1) Well-Woman Exam -pap collected, reviewed ASCCP guidelines -mammogram ordered   Orders Placed This Encounter  Procedures   MM 3D SCREENING MAMMOGRAM BILATERAL BREAST    Meds: No orders of the defined types were placed in this encounter.   Follow-up: Return in about 1 year (around 04/23/2024) for Annual.   [name], DO Attending Obstetrician & Gynecologist, [facility] for [facility], [facility]

## 2023-04-28 ENCOUNTER — Encounter (HOSPITAL_COMMUNITY): Payer: Self-pay | Admitting: Radiology

## 2023-04-28 ENCOUNTER — Ambulatory Visit (HOSPITAL_COMMUNITY)
Admission: RE | Admit: 2023-04-28 | Discharge: 2023-04-28 | Disposition: A | Discharge status: Home / Self Care | Payer: 59 | Source: Ambulatory Visit | Attending: Hematology | Admitting: Hematology

## 2023-04-28 ENCOUNTER — Other Ambulatory Visit: Payer: Self-pay

## 2023-04-28 ENCOUNTER — Inpatient Hospital Stay: Payer: 59 | Admitting: Hematology

## 2023-04-28 DIAGNOSIS — C44500 Unspecified malignant neoplasm of anal skin: Secondary | ICD-10-CM | POA: Diagnosis present

## 2023-04-28 DIAGNOSIS — C210 Malignant neoplasm of anus, unspecified: Secondary | ICD-10-CM | POA: Diagnosis not present

## 2023-04-28 MED ORDER — IOHEXOL 300 MG/ML  SOLN
100.0000 mL | Freq: Once | INTRAMUSCULAR | Status: AC | PRN
  Administered 2023-04-28: 100 mL via INTRAVENOUS

## 2023-04-28 NOTE — Progress Notes (Signed)
Order placed for STAT CT CAP per Dr. Ellin Saba

## 2023-04-29 ENCOUNTER — Encounter (HOSPITAL_COMMUNITY): Payer: Self-pay | Admitting: Internal Medicine

## 2023-04-29 ENCOUNTER — Other Ambulatory Visit: Payer: Self-pay

## 2023-04-29 DIAGNOSIS — C210 Malignant neoplasm of anus, unspecified: Secondary | ICD-10-CM

## 2023-04-29 NOTE — Progress Notes (Deleted)
Marie Duncan with [facility] called with report on patient's CT abdomen pelvis from yesterday.  Impression # 3. Faintly hyperenhancing subcapsular lesion adjacent to the gallbladder fossa, central hepatic segment IV B, measuring 2.6 x 1.4 cm. This is not typical in appearance for hepatic metastatic disease, particularly in the absence of other evidence, and a benign lesion such as focal fatty sparing, focal nodular hyperplasia, or hepatic adenoma is strongly favored. Nonetheless, recommend Eovist enhanced abdominal MRI to more confidently characterize. Please indicate Eovist contrast on imaging order.  Dr. Ellin Saba made aware of scan results. MRI of Liver with and without Eovist contrast ordered per Dr. Marice Potter orders.

## 2023-04-29 NOTE — Progress Notes (Signed)
Jane with [facility] called with report on patient's CT abdomen pelvis from yesterday. Impression # 3. Faintly hyperenhancing subcapsular lesion adjacent to the gallbladder fossa, central hepatic segment IV B, measuring 2.6 x 1.4 cm. This is not typical in appearance for hepatic metastatic disease, particularly in the absence of other evidence, and a benign lesion such as focal fatty sparing, focal nodular hyperplasia, or hepatic adenoma is strongly favored. Nonetheless, recommend Eovist enhanced abdominal MRI to more confidently characterize. Please indicate Eovist contrast on imaging order.   Dr. Ellin Saba made aware of scan results. MRI of Liver with and without Eovist contrast ordered per Dr. Marice Potter orders. Schedulers up front to make appointment.

## 2023-04-30 ENCOUNTER — Encounter (HOSPITAL_COMMUNITY): Payer: 59

## 2023-04-30 ENCOUNTER — Encounter (HOSPITAL_COMMUNITY): Payer: Self-pay

## 2023-04-30 ENCOUNTER — Ambulatory Visit (HOSPITAL_COMMUNITY)
Admission: RE | Admit: 2023-04-30 | Discharge: 2023-04-30 | Disposition: A | Discharge status: Home / Self Care | Payer: 59 | Source: Ambulatory Visit | Attending: Hematology | Admitting: Hematology

## 2023-04-30 DIAGNOSIS — C210 Malignant neoplasm of anus, unspecified: Secondary | ICD-10-CM | POA: Insufficient documentation

## 2023-04-30 MED ORDER — GADOBUTROL 1 MMOL/ML IV SOLN
8.0000 mL | Freq: Once | INTRAVENOUS | Status: AC | PRN
  Administered 2023-04-30: 8 mL via INTRAVENOUS

## 2023-05-04 ENCOUNTER — Ambulatory Visit (HOSPITAL_COMMUNITY)
Admission: RE | Admit: 2023-05-04 | Discharge: 2023-05-04 | Disposition: A | Discharge status: Home / Self Care | Payer: 59 | Source: Ambulatory Visit | Attending: Obstetrics & Gynecology | Admitting: Obstetrics & Gynecology

## 2023-05-04 ENCOUNTER — Ambulatory Visit (HOSPITAL_COMMUNITY): Payer: 59

## 2023-05-04 ENCOUNTER — Encounter (HOSPITAL_COMMUNITY): Payer: Self-pay

## 2023-05-04 DIAGNOSIS — Z1231 Encounter for screening mammogram for malignant neoplasm of breast: Secondary | ICD-10-CM | POA: Diagnosis present

## 2023-05-04 LAB — IGP,CTNG,APTIMAHPV,RFX16/18,45
Chlamydia, Nuc. Acid Amp: NEGATIVE
Gonococcus by Nucleic Acid Amp: NEGATIVE
HPV Aptima: POSITIVE — AB

## 2023-05-05 NOTE — Progress Notes (Signed)
Marie Duncan [address] [place] [address]    Clinic Day:  05/06/2023  Referring physician: Elfredia Nevins, MD  Patient Care Team: Marie Nevins, MD as PCP - General (Internal Medicine) Marie Massed, MD as Medical Oncologist (Medical Oncology) Therese Sarah, RN as Oncology Nurse Navigator (Medical Oncology)   ASSESSMENT & PLAN:   Assessment: 1.  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 - She also fell on tailbone in November 2023 and has been having tailbone bone pain on and off. - She was evaluated by Dr.Pappayliou on 03/26/2023.  Bowels changed every other day.  Examination showed 3 cm ulcerative lesion in the perianal region at the 3 o'clock position.  No evidence of external hemorrhoids.  Small internal hemorrhoids on digital rectal exam. - Biopsy (04/01/2023): Moderately differentiated squamous cell carcinoma   2.  Social/family history: - She lives with family at home.  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  Quit smoking on 01/17/2023.  Smoked 1 pack/day started at age 64. - Mother had breast cancer at age 70.  Maternal grandfather had prostate cancer.  Maternal grandmother had kidney cancer.  Maternal great grandfather had leukemia.    Plan: 1.  Moderately differentiated SCC of the perianal region (T2 N0): - She underwent colonoscopy on 04/22/2023 which did not reveal any tumors.  Tubular adenoma from the descending colon was removed. - She also underwent Pap smear which was positive for HPV.  She is scheduled for colposcopy. - Screening mammogram was BI-RADS Category 0 with possible asymmetry in the right breast.  She will have additional imaging. - Her insurance denied PET scan.  We have done CT CAP on 04/28/2023: No evidence of lymphadenopathy or metastatic disease in the chest, abdomen or pelvis.  2.6 x 1.4 cm  hyperenhancing subcapsular lesion in the hepatic segment 4.  Fatty liver was seen. - MRI of the liver on 04/30/2023: Moderate hepatic steatosis.  Focal fatty sparing in the central left hepatic lobe.  No evidence of metastatic disease. - Clinically she has T2 well-differentiated disease.  She does not have any lymph node involvement.  She may proceed with local excision with adequate margins. - I will make a referral to Dr. Maisie Fus. - RTC 1 month after surgery.     No orders of the defined types were placed in this encounter.     I,Marie Duncan,acting as a Neurosurgeon for Marie Massed, MD.,have documented all relevant documentation on the behalf of Marie Massed, MD,as directed by  Marie Massed, MD while in the presence of Marie Massed, MD.   I, Marie Massed MD, have reviewed the above documentation for accuracy and completeness, and I agree with the above.   Marie Massed, MD   6/19/20245:23 PM  CHIEF COMPLAINT:   Diagnosis: anal squamous cell carcinoma    Cancer Staging  No matching staging information was found for the patient.   Prior Therapy: none  Current Therapy: Surgical resection   HISTORY OF PRESENT ILLNESS:   Oncology History  Primary cancer of perianal skin  04/13/2023 Initial Diagnosis   Primary cancer of perianal skin      INTERVAL HISTORY:   Marie Duncan is a 42 y.o. female presenting to clinic today for follow up of anal squamous cell carcinoma. She was last seen by me on 04/14/23 in consultation.  Since her last visit, she underwent colonoscopy on 04/22/23 under Dr.  Carver showing: 3 cm lesion consistent with known SCC; one 5 mm polyp in descending colon; non-bleeding internal hemorrhoids; otherwise normal exam. Pathology from the polyp showed tubular adenoma.  She also underwent staging CT C/A/P on 04/28/23 showing: soft tissue thickening near anal verge; no definite evidence of lymphadenopathy or metastatic disease;  faintly hyperenhancing 2.6 cm subcapsular liver lesion adjacent to gallbladder fossa, benign lesion strongly favored; hepatic steatosis and hepatomegaly.  Further evaluation of the liver lesion with liver MRI on 04/30/23 showed: moderate hepatic steatosis; benign focal fatty sparing in central left hepatic lobe, corresponding with lesion seen on recent CT; no evidence of hepatic metastatic disease.  Today, she states that she is doing well overall. Her appetite level is at 50%. Her energy level is at 60%.  PAST MEDICAL HISTORY:   Past Medical History: Past Medical History:  Diagnosis Date   Allergy    Anal cancer [facility])    Anxiety     Surgical History: Past Surgical History:  Procedure Laterality Date   COLONOSCOPY WITH PROPOFOL N/A 04/22/2023   Procedure: COLONOSCOPY WITH PROPOFOL;  Surgeon: Lanelle Bal, DO;  Location: [facility];  Service: Endoscopy;  Laterality: N/A;  1:15 pm, asa 2   POLYPECTOMY  04/22/2023   Procedure: POLYPECTOMY;  Surgeon: Lanelle Bal, DO;  Location: [facility];  Service: Endoscopy;;   RECTAL BIOPSY N/A 04/01/2023   Procedure: PERIANAL LESION BIOPSY;  Surgeon: Lewie Chamber, DO;  Location: [facility] ORS;  Service: General;  Laterality: N/A;   TUBAL LIGATION      Social History: Social History   Socioeconomic History   Marital status: Legally Separated    Spouse name: Not on file   Number of children: Not on file   Years of education: Not on file   Highest education level: Not on file  Occupational History   Not on file  Tobacco Use   Smoking status: Former   Smokeless tobacco: Former    Quit date: 01/17/2023  Vaping Use   Vaping Use: Some days  Substance and Sexual Activity   Alcohol use: No   Drug use: Yes    Frequency: 7.0 times per week    Types: Marijuana    Comment: once daily   Sexual activity: Not Currently    Birth control/protection: Surgical    Comment: tubal  Other Topics Concern   Not on file  Social History  Narrative   Not on file   Social Determinants of Health   Financial Resource Strain: Low Risk  (04/24/2023)   Overall Financial Resource Strain (CARDIA)    Difficulty of Paying Living Expenses: Not very hard  Food Insecurity: No Food Insecurity (04/24/2023)   Hunger Vital Sign    Worried About Running Out of Food in the Last Year: Never true    Ran Out of Food in the Last Year: Never true  Transportation Needs: No Transportation Needs (04/14/2023)   PRAPARE - Administrator, Civil Service (Medical): No    Lack of Transportation (Non-Medical): No  Physical Activity: Insufficiently Active (04/24/2023)   Exercise Vital Sign    Days of Exercise per Week: 1 day    Minutes of Exercise per Session: 10 min  Stress: Stress Concern Present (04/24/2023)   [employer] of Occupational Health - Occupational Stress Questionnaire    Feeling of Stress : Rather much  Social Connections: Socially Isolated (04/24/2023)   Social Connection and Isolation Panel [NHANES]    Frequency of Communication with  Friends and Family: Twice a week    Frequency of Social Gatherings with Friends and Family: Once a week    Attends Religious Services: Never    Database administrator or Organizations: No    Attends Banker Meetings: Never    Marital Status: Separated  Intimate Partner Violence: Not At Risk (04/24/2023)   Humiliation, Afraid, Rape, and Kick questionnaire    Fear of Current or Ex-Partner: No    Emotionally Abused: No    Physically Abused: No    Sexually Abused: No    Family History: Family History  Problem Relation Age of Onset   Cancer Mother    Alcohol abuse Father    Drug abuse Father    Early death Father    Hearing loss Maternal Grandmother    Heart disease Maternal Grandmother    Kidney cancer Maternal Grandmother    Prostate cancer Maternal Grandfather    Colon cancer Neg Hx     Current Medications:  Current Outpatient Medications:    docusate sodium (COLACE)  100 MG capsule, Take 1 capsule (100 mg total) by mouth 2 (two) times daily., Disp: 60 capsule, Rfl: 2   Multiple Vitamins-Minerals (MULTIVITAMIN GUMMIES WOMENS) CHEW, Chew 1 tablet by mouth daily., Disp: , Rfl:    traZODone (DESYREL) 100 MG tablet, Take 100 mg by mouth at bedtime as needed for sleep., Disp: , Rfl:    Allergies: Allergies  Allergen Reactions   Penicillins     unknown    REVIEW OF SYSTEMS:   Review of Systems  Constitutional:  Negative for chills, fatigue and fever.  HENT:   Negative for lump/mass, mouth sores, nosebleeds, sore throat and trouble swallowing.   Eyes:  Negative for eye problems.  Respiratory:  Negative for cough and shortness of breath.   Cardiovascular:  Negative for chest pain, leg swelling and palpitations.  Gastrointestinal:  Positive for nausea. Negative for abdominal pain, constipation, diarrhea and vomiting.  Genitourinary:  Negative for bladder incontinence, difficulty urinating, dysuria, frequency, hematuria and nocturia.   Musculoskeletal:  Negative for arthralgias, back pain, flank pain, myalgias and neck pain.  Skin:  Negative for itching and rash.  Neurological:  Positive for headaches. Negative for dizziness and numbness.  Hematological:  Does not bruise/bleed easily.  Psychiatric/Behavioral:  Negative for depression, sleep disturbance and suicidal ideas. The patient is nervous/anxious.   All other systems reviewed and are negative.    VITALS:   Blood pressure 123/89, pulse 83, temperature 97.8 F (36.6 C), temperature source Oral, resp. rate 16, weight 181 lb 9.6 oz (82.4 kg), last menstrual period 05/03/2023, SpO2 99 %.  Wt Readings from Last 3 Encounters:  05/06/23 181 lb 9.6 oz (82.4 kg)  04/24/23 183 lb (83 kg)  04/22/23 184 lb (83.5 kg)    Body mass index is 28.44 kg/m.  Performance status (ECOG): 0 - Asymptomatic  PHYSICAL EXAM:   Physical Exam Vitals and nursing note reviewed. Exam conducted with a chaperone present.   Constitutional:      Appearance: Normal appearance.  Cardiovascular:     Rate and Rhythm: Normal rate and regular rhythm.     Pulses: Normal pulses.     Heart sounds: Normal heart sounds.  Pulmonary:     Effort: Pulmonary effort is normal.     Breath sounds: Normal breath sounds.  Abdominal:     Palpations: Abdomen is soft. There is no hepatomegaly, splenomegaly or mass.     Tenderness: There is no abdominal tenderness.  Musculoskeletal:     Right lower leg: No edema.     Left lower leg: No edema.  Lymphadenopathy:     Cervical: No cervical adenopathy.     Right cervical: No superficial, deep or posterior cervical adenopathy.    Left cervical: No superficial, deep or posterior cervical adenopathy.     Upper Body:     Right upper body: No supraclavicular or axillary adenopathy.     Left upper body: No supraclavicular or axillary adenopathy.  Neurological:     General: No focal deficit present.     Mental Status: She is alert and oriented to person, place, and time.  Psychiatric:        Mood and Affect: Mood normal.        Behavior: Behavior normal.     LABS:      Latest Ref Rng & Units 04/14/2023    8:46 AM  CBC  WBC 4.0 - 10.5 K/uL 8.6   Hemoglobin 12.0 - 15.0 g/dL 16.1   Hematocrit 09.6 - 46.0 % 40.9   Platelets 150 - 400 K/uL 314       Latest Ref Rng & Units 04/14/2023    8:46 AM  CMP  Glucose 70 - 99 mg/dL 95   BUN 6 - 20 mg/dL 13   Creatinine 0.45 - 1.00 mg/dL 4.09   Sodium 811 - 914 mmol/L 135   Potassium 3.5 - 5.1 mmol/L 3.9   Chloride 98 - 111 mmol/L 102   CO2 22 - 32 mmol/L 19   Calcium 8.9 - 10.3 mg/dL 9.3   Total Protein 6.5 - 8.1 g/dL 7.5   Total Bilirubin 0.3 - 1.2 mg/dL 0.5   Alkaline Phos 38 - 126 U/L 51   AST 15 - 41 U/L 21   ALT 0 - 44 U/L 26      No results found for: "CEA1", "CEA" / No results found for: "CEA1", "CEA" No results found for: "PSA1" No results found for: "NWG956" No results found for: "CAN125"  No results found for:  "TOTALPROTELP", "ALBUMINELP", "A1GS", "A2GS", "BETS", "BETA2SER", "GAMS", "MSPIKE", "SPEI" No results found for: "TIBC", "FERRITIN", "IRONPCTSAT" No results found for: "LDH"   STUDIES:   MM 3D SCREENING MAMMOGRAM BILATERAL BREAST  Result Date: 05/06/2023 CLINICAL DATA:  Screening. EXAM: DIGITAL SCREENING BILATERAL MAMMOGRAM WITH TOMOSYNTHESIS AND CAD TECHNIQUE: Bilateral screening digital craniocaudal and mediolateral oblique mammograms were obtained. Bilateral screening digital breast tomosynthesis was performed. The images were evaluated with computer-aided detection. COMPARISON:  None available. ACR Breast Density Category b: There are scattered areas of fibroglandular density. FINDINGS: In the right breast, a possible asymmetry warrants further evaluation. In the left breast, no findings suspicious for malignancy. IMPRESSION: Further evaluation is suggested for possible asymmetry in the right breast. RECOMMENDATION: Diagnostic mammogram and possibly ultrasound of the right breast. (Code:[id number]) The patient will be contacted regarding the findings, and additional imaging will be scheduled. BI-RADS CATEGORY  0: Incomplete: Need additional imaging evaluation. Electronically Signed   By: Norva Pavlov M.D.   On: 05/06/2023 10:02  MR LIVER W WO CONTRAST  Result Date: 05/01/2023 CLINICAL DATA:  Anal carcinoma. Indeterminate liver lesion on recent CT. EXAM: MRI ABDOMEN WITHOUT AND WITH CONTRAST TECHNIQUE: Multiplanar multisequence MR imaging of the abdomen was performed both before and after the administration of intravenous contrast. CONTRAST:  8mL GADAVIST GADOBUTROL 1 MMOL/ML IV SOLN COMPARISON:  CT on 04/28/2023 FINDINGS: Lower chest: No acute findings. Hepatobiliary: Moderate diffuse hepatic steatosis is demonstrated. Focal area of  fatty sparing is seen in the central left hepatic lobe adjacent to the porta hepatis, which corresponds with the lesion seen on recent CT. No hepatic masses  identified. Gallbladder is unremarkable. No evidence of biliary ductal dilatation. Pancreas:  No mass or inflammatory changes. Spleen:  Within normal limits in size and appearance. Adrenals/Urinary Tract: No suspicious masses identified. No evidence of hydronephrosis. Stomach/Bowel: Unremarkable. Vascular/Lymphatic: No pathologically enlarged lymph nodes identified. No acute vascular findings. Other:  None. Musculoskeletal:  No suspicious bone lesions identified. IMPRESSION: Moderate hepatic steatosis. Benign focal fatty sparing in the central left hepatic lobe, which corresponds with the lesion seen on recent CT. No evidence of hepatic metastatic disease. Electronically Signed   By: Danae Orleans M.D.   On: 05/01/2023 21:24   CT CHEST ABDOMEN PELVIS W CONTRAST  Result Date: 04/28/2023 CLINICAL DATA:  New diagnosis anal cancer staging, pelvic floor pain * Tracking Code: BO * EXAM: CT CHEST, ABDOMEN, AND PELVIS WITH CONTRAST TECHNIQUE: Multidetector CT imaging of the chest, abdomen and pelvis was performed following the standard protocol during bolus administration of intravenous contrast. RADIATION DOSE REDUCTION: This exam was performed according to the departmental dose-optimization program which includes automated exposure control, adjustment of the mA and/or kV according to patient size and/or use of iterative reconstruction technique. CONTRAST:  OMNIPAQUE IOHEXOL 300 MG/ML  SOLN COMPARISON:  None Available. FINDINGS: CT CHEST FINDINGS Cardiovascular: No significant vascular findings. Normal heart size. No pericardial effusion. Mediastinum/Nodes: No enlarged mediastinal, hilar, or axillary lymph nodes. Thyroid gland, trachea, and esophagus demonstrate no significant findings. Lungs/Pleura: Minimal dependent bibasilar atelectasis or scarring. No pleural effusion or pneumothorax. Musculoskeletal: No chest wall abnormality. No acute osseous findings. CT ABDOMEN PELVIS FINDINGS Hepatobiliary: Hepatic  steatosis. Hepatomegaly, maximum coronal span 21.1 cm. Faintly hyperenhancing subcapsular lesion adjacent to the gallbladder fossa, central hepatic segment IV B, measuring 2.6 x 1.4 cm (series 2, image 64). No gallstones, gallbladder wall thickening, or biliary dilatation. Pancreas: Unremarkable. No pancreatic ductal dilatation or surrounding inflammatory changes. Spleen: Normal in size without significant abnormality. Adrenals/Urinary Tract: Adrenal glands are unremarkable. Simple, benign bilateral renal cortical cysts and tiny subcentimeter lesions too small to characterize although most likely additional cysts, for which no further follow-up or characterization is required. Kidneys are otherwise normal, without renal calculi, solid lesion, or hydronephrosis. Bladder is decompressed although unremarkable. Stomach/Bowel: Stomach is within normal limits. Appendix appears normal. No evidence of bowel wall thickening, distention, or inflammatory changes. Soft tissue thickening near the anal verge (series 2, image 126). Vascular/Lymphatic: No significant vascular findings are present. Incidental note of a retroaortic left renal vein. No enlarged abdominal or pelvic lymph nodes. Reproductive: No mass or other abnormality. Other: No abdominal wall hernia or abnormality. No ascites. Musculoskeletal: No acute osseous findings. IMPRESSION: 1. Soft tissue thickening near the anal verge, which may reflect known anal malignancy or alternately external hemorrhoids. Correlate with physical examination 2. No definite evidence of lymphadenopathy or metastatic disease in the chest, abdomen, or pelvis. 3. Faintly hyperenhancing subcapsular lesion adjacent to the gallbladder fossa, central hepatic segment IV B, measuring 2.6 x 1.4 cm. This is not typical in appearance for hepatic metastatic disease, particularly in the absence of other evidence, and a benign lesion such as focal fatty sparing, focal nodular hyperplasia, or hepatic  adenoma is strongly favored. Nonetheless, recommend Eovist enhanced abdominal MRI to more confidently characterize. Please indicate Eovist contrast on imaging order. 4. Hepatic steatosis and hepatomegaly. These results will be called to the ordering clinician or representative  by the Radiologist Assistant, and communication documented in the PACS or [employer]. Electronically Signed   By: Jearld Lesch M.D.   On: 04/28/2023 15:50

## 2023-05-05 NOTE — Progress Notes (Signed)
[facility] requesting office notes from 04/28/2023 for Outline of Treatment Plan.  No note at this time.  Office note from 04/14/2023 faxed with confirmation received.

## 2023-05-06 ENCOUNTER — Inpatient Hospital Stay: Payer: 59 | Attending: Hematology | Admitting: Hematology

## 2023-05-06 ENCOUNTER — Other Ambulatory Visit (HOSPITAL_COMMUNITY): Payer: Self-pay | Admitting: Obstetrics & Gynecology

## 2023-05-06 VITALS — BP 123/89 | HR 83 | Temp 97.8°F | Resp 16 | Wt 181.6 lb

## 2023-05-06 DIAGNOSIS — R928 Other abnormal and inconclusive findings on diagnostic imaging of breast: Secondary | ICD-10-CM

## 2023-05-06 DIAGNOSIS — Z87891 Personal history of nicotine dependence: Secondary | ICD-10-CM | POA: Diagnosis not present

## 2023-05-06 DIAGNOSIS — C210 Malignant neoplasm of anus, unspecified: Secondary | ICD-10-CM | POA: Diagnosis present

## 2023-05-06 NOTE — Patient Instructions (Signed)
[facility] Discharge Instructions   You were seen and examined today by Dr. Ellin Saba.  He reviewed the results of your CT scan which was normal.   We will refer you to Dr. Maisie Fus. She is a Careers adviser who can potentially remove the cancer.   We will order a repeat mammogram and ultrasound of the right breast due to they did not get good images on the last mammogram.    Thank you for choosing [facility] at [facility] to provide your oncology and hematology care.  To afford each patient quality time with our provider, please arrive at least 15 minutes before your scheduled appointment time.   If you have a lab appointment with the Cancer Center please come in thru the Main Entrance and check in at the main information desk.  You need to re-schedule your appointment should you arrive 10 or more minutes late.  We strive to give you quality time with our providers, and arriving late affects you and other patients whose appointments are after yours.  Also, if you no show three or more times for appointments you may be dismissed from the clinic at the providers discretion.     Again, thank you for choosing [facility].  Our hope is that these requests will decrease the amount of time that you wait before being seen by our physicians.       _____________________________________________________________  Should you have questions after your visit to [facility], please contact our office at [phone] and follow the prompts.  Our office hours are 8:00 a.m. and 4:30 p.m. Monday - Friday.  Please note that voicemails left after 4:00 p.m. may not be returned until the following business day.  We are closed weekends and major holidays.  You do have access to a nurse 24-7, just call the main number to the clinic [phone] and do not press any options, hold on the line and a nurse will answer the phone.    For  prescription refill requests, have your pharmacy contact our office and allow 72 hours.    Due to Covid, you will need to wear a mask upon entering the hospital. If you do not have a mask, a mask will be given to you at the Main Entrance upon arrival. For doctor visits, patients may have 1 support person age 50 or older with them. For treatment visits, patients can not have anyone with them due to social distancing guidelines and our immunocompromised population.

## 2023-05-11 ENCOUNTER — Ambulatory Visit (HOSPITAL_COMMUNITY): Payer: 59

## 2023-05-11 NOTE — Progress Notes (Signed)
05/08/2023-[employer] requesting PET and Treatment plan.  Oncology follow up note for 05/08/2023 sent to answer questions.

## 2023-05-12 ENCOUNTER — Ambulatory Visit (HOSPITAL_COMMUNITY)
Admission: RE | Admit: 2023-05-12 | Discharge: 2023-05-12 | Disposition: A | Discharge status: Home / Self Care | Payer: 59 | Source: Ambulatory Visit | Attending: Obstetrics & Gynecology | Admitting: Obstetrics & Gynecology

## 2023-05-12 ENCOUNTER — Ambulatory Visit (HOSPITAL_COMMUNITY): Payer: 59

## 2023-05-12 DIAGNOSIS — R928 Other abnormal and inconclusive findings on diagnostic imaging of breast: Secondary | ICD-10-CM | POA: Insufficient documentation

## 2023-05-13 ENCOUNTER — Ambulatory Visit (HOSPITAL_COMMUNITY): Payer: 59

## 2023-05-18 ENCOUNTER — Ambulatory Visit: Payer: Self-pay | Admitting: General Surgery

## 2023-05-18 NOTE — H&P (Signed)
REFERRING PHYSICIAN:  Doreatha Massed, MD   PROVIDER:  Elenora Gamma, MD   MRN: [id number] DOB: October 16, 1981 DATE OF ENCOUNTER: 05/18/2023   Subjective   Chief Complaint: Rectal Pain       History of Present Illness: Marie Duncan is a 42 y.o. female who is seen today as an office consultation at the request of Dr. Ellin Saba for evaluation of Rectal Pain .  Patient has noticed a mass in her perianal region since December.  She was seen by her doctor and biopsy was obtained.  This showed squamous cell carcinoma.  CT scan of the chest abdomen and pelvis showed a possible lesion in the liver but no signs of lymphadenopathy.  She is here today for further evaluation.  She reports quite a bit of pain and bleeding with bowel movements.     Review of Systems: A complete review of systems was obtained from the patient.  I have reviewed this information and discussed as appropriate with the patient.  See HPI as well for other ROS.       Medical History: Past Medical History Past Medical History: Diagnosis Date  Anxiety    History of cancer         Problem List There is no problem list on file for this patient.     Past Surgical History Past Surgical History: Procedure Laterality Date  ESSURE TUBAL LIGATION          Allergies Allergies Allergen Reactions  Penicillins Unknown     unknown      Medications Ordered Prior to Encounter Current Outpatient Medications on File Prior to Visit Medication Sig Dispense Refill  traZODone (DESYREL) 100 MG tablet Take 100 mg by mouth at bedtime        No current facility-administered medications on file prior to visit.      Family History Family History Problem Relation Age of Onset  Breast cancer Mother        Tobacco Use History Social History    Tobacco Use Smoking Status Former  Types: Cigarettes Smokeless Tobacco Never      Social History Social History    Socioeconomic History  Marital  status: Legally Separated Tobacco Use  Smoking status: Former     Types: Cigarettes  Smokeless tobacco: Never Vaping Use  Vaping status: Never Used Substance and Sexual Activity  Alcohol use: Not Currently  Drug use: Never    Social Determinants of Health    Financial Resource Strain: Low Risk  (04/24/2023)   Received from [facility]   Overall Financial Resource Strain (CARDIA)    Difficulty of Paying Living Expenses: Not very hard Food Insecurity: No Food Insecurity (04/24/2023)   Received from [facility]   Hunger Vital Sign    Worried About Running Out of Food in the Last Year: Never true    Ran Out of Food in the Last Year: Never true Transportation Needs: No Transportation Needs (04/14/2023)   Received from [facility] - Transportation    Lack of Transportation (Medical): No    Lack of Transportation (Non-Medical): No Physical Activity: Insufficiently Active (04/24/2023)   Received from [facility]   Exercise Vital Sign    Days of Exercise per Week: 1 day    Minutes of Exercise per Session: 10 min Stress: Stress Concern Present (04/24/2023)   Received from [facility] - Occupational Stress Questionnaire    Feeling of Stress : Rather much Social Connections:  Socially Isolated (04/24/2023)   Received from [facility]   Social Connection and Isolation Panel [NHANES]    Frequency of Communication with Friends and Family: Twice a week    Frequency of Social Gatherings with Friends and Family: Once a week    Attends Religious Services: Never    Database administrator or Organizations: No    Attends Banker Meetings: Never    Marital Status: Separated      Objective:     Vitals:   05/18/23 0930 05/18/23 0932 BP: 128/84   Pulse: (!) 113   Temp: 36.9 C (98.4 F)   SpO2: 97%   Weight: 83.9 kg (185 lb)   Height: 170.2 cm (5\' 7" )   PainSc:   0-No pain     Exam Gen: NAD Abd: soft Rectal: Left posterior  lateral mass approximately 6 cm x 4 cm.  Minimal involvement of the sphincter complex medially.     Labs, Imaging and Diagnostic Testing: CT reviewed   Assessment and Plan: Diagnoses and all orders for this visit:   Squamous cell carcinoma of anal margin     42 year old female with anal margin cancer.  I believe this can be excised with minimal excision of sphincter.  We have discussed this in detail including the possible need for postoperative radiation for adjuvant treatment.  We did discuss alternative treatment of chemoradiation as well and the risk and benefits associated with that.  All questions were answered.  Patient would like to proceed with surgery.   [name], MD Colon and Rectal Surgery [facility]

## 2023-05-18 NOTE — H&P (View-Only) (Signed)
REFERRING PHYSICIAN:  Doreatha Massed, MD   PROVIDER:  Elenora Gamma, MD   MRN: [id number] DOB: October 16, 1981 DATE OF ENCOUNTER: 05/18/2023   Subjective   Chief Complaint: Rectal Pain       History of Present Illness: Marie Duncan is a 42 y.o. female who is seen today as an office consultation at the request of Dr. Ellin Saba for evaluation of Rectal Pain .  Patient has noticed a mass in her perianal region since December.  She was seen by her doctor and biopsy was obtained.  This showed squamous cell carcinoma.  CT scan of the chest abdomen and pelvis showed a possible lesion in the liver but no signs of lymphadenopathy.  She is here today for further evaluation.  She reports quite a bit of pain and bleeding with bowel movements.     Review of Systems: A complete review of systems was obtained from the patient.  I have reviewed this information and discussed as appropriate with the patient.  See HPI as well for other ROS.       Medical History: Past Medical History Past Medical History: Diagnosis Date  Anxiety    History of cancer         Problem List There is no problem list on file for this patient.     Past Surgical History Past Surgical History: Procedure Laterality Date  ESSURE TUBAL LIGATION          Allergies Allergies Allergen Reactions  Penicillins Unknown     unknown      Medications Ordered Prior to Encounter Current Outpatient Medications on File Prior to Visit Medication Sig Dispense Refill  traZODone (DESYREL) 100 MG tablet Take 100 mg by mouth at bedtime        No current facility-administered medications on file prior to visit.      Family History Family History Problem Relation Age of Onset  Breast cancer Mother        Tobacco Use History Social History    Tobacco Use Smoking Status Former  Types: Cigarettes Smokeless Tobacco Never      Social History Social History    Socioeconomic History  Marital  status: Legally Separated Tobacco Use  Smoking status: Former     Types: Cigarettes  Smokeless tobacco: Never Vaping Use  Vaping status: Never Used Substance and Sexual Activity  Alcohol use: Not Currently  Drug use: Never    Social Determinants of Health    Financial Resource Strain: Low Risk  (04/24/2023)   Received from [facility]   Overall Financial Resource Strain (CARDIA)    Difficulty of Paying Living Expenses: Not very hard Food Insecurity: No Food Insecurity (04/24/2023)   Received from [facility]   Hunger Vital Sign    Worried About Running Out of Food in the Last Year: Never true    Ran Out of Food in the Last Year: Never true Transportation Needs: No Transportation Needs (04/14/2023)   Received from [facility] - Transportation    Lack of Transportation (Medical): No    Lack of Transportation (Non-Medical): No Physical Activity: Insufficiently Active (04/24/2023)   Received from [facility]   Exercise Vital Sign    Days of Exercise per Week: 1 day    Minutes of Exercise per Session: 10 min Stress: Stress Concern Present (04/24/2023)   Received from [facility] - Occupational Stress Questionnaire    Feeling of Stress : Rather much Social Connections:  Socially Isolated (04/24/2023)   Received from [facility]   Social Connection and Isolation Panel [NHANES]    Frequency of Communication with Friends and Family: Twice a week    Frequency of Social Gatherings with Friends and Family: Once a week    Attends Religious Services: Never    Database administrator or Organizations: No    Attends Banker Meetings: Never    Marital Status: Separated      Objective:     Vitals:   05/18/23 0930 05/18/23 0932 BP: 128/84   Pulse: (!) 113   Temp: 36.9 C (98.4 F)   SpO2: 97%   Weight: 83.9 kg (185 lb)   Height: 170.2 cm (5\' 7" )   PainSc:   0-No pain     Exam Gen: NAD Abd: soft Rectal: Left posterior  lateral mass approximately 6 cm x 4 cm.  Minimal involvement of the sphincter complex medially.     Labs, Imaging and Diagnostic Testing: CT reviewed   Assessment and Plan: Diagnoses and all orders for this visit:   Squamous cell carcinoma of anal margin     42 year old female with anal margin cancer.  I believe this can be excised with minimal excision of sphincter.  We have discussed this in detail including the possible need for postoperative radiation for adjuvant treatment.  We did discuss alternative treatment of chemoradiation as well and the risk and benefits associated with that.  All questions were answered.  Patient would like to proceed with surgery.   [name], MD Colon and Rectal Surgery [facility]

## 2023-05-27 ENCOUNTER — Encounter (HOSPITAL_BASED_OUTPATIENT_CLINIC_OR_DEPARTMENT_OTHER): Payer: Self-pay | Admitting: General Surgery

## 2023-06-01 ENCOUNTER — Other Ambulatory Visit: Payer: Self-pay | Admitting: Obstetrics & Gynecology

## 2023-06-01 ENCOUNTER — Encounter (HOSPITAL_BASED_OUTPATIENT_CLINIC_OR_DEPARTMENT_OTHER): Payer: Self-pay | Admitting: General Surgery

## 2023-06-01 DIAGNOSIS — Z01818 Encounter for other preprocedural examination: Secondary | ICD-10-CM

## 2023-06-01 MED ORDER — ALPRAZOLAM 0.5 MG PO TABS
0.5000 mg | ORAL_TABLET | Freq: Once | ORAL | 0 refills | Status: AC
Start: 2023-06-01 — End: 2023-06-01

## 2023-06-01 NOTE — Progress Notes (Signed)
Rx for xanax prior to colposcopy  [name], DO Attending Obstetrician & Gynecologist, [facility] for [facility], [facility]

## 2023-06-01 NOTE — Progress Notes (Signed)
Spoke w/ via phone for pre-op interview--- pt Lab needs dos----  urine preg             Lab results------ no COVID test -----patient states asymptomatic no test needed Arrive at ------- 1100 on 06-17-2023 NPO after MN NO Solid Food.  Clear liquids from MN until--- 1000 Med rec completed Medications to take morning of surgery ----- none Diabetic medication ----- n/a Patient instructed no nail polish to be worn day of surgery Patient instructed to bring photo id and insurance card day of surgery Patient aware to have Driver (ride ) / caregiver    for 24 hours after surgery --- sig other, keith Patient Special Instructions ----- n/a Pre-Op special Instructions ----- n/a Patient verbalized understanding of instructions that were given at this phone interview. Patient denies shortness of breath, chest pain, fever, cough at this phone interview.

## 2023-06-02 ENCOUNTER — Other Ambulatory Visit (HOSPITAL_COMMUNITY)
Admission: RE | Admit: 2023-06-02 | Discharge: 2023-06-02 | Disposition: A | Discharge status: Home / Self Care | Payer: 59 | Source: Ambulatory Visit | Attending: Obstetrics & Gynecology | Admitting: Obstetrics & Gynecology

## 2023-06-02 ENCOUNTER — Encounter: Payer: Self-pay | Admitting: Obstetrics & Gynecology

## 2023-06-02 ENCOUNTER — Ambulatory Visit (INDEPENDENT_AMBULATORY_CARE_PROVIDER_SITE_OTHER): Payer: 59 | Admitting: Obstetrics & Gynecology

## 2023-06-02 VITALS — BP 123/76 | HR 74 | Ht 67.0 in | Wt 190.0 lb

## 2023-06-02 DIAGNOSIS — Z3202 Encounter for pregnancy test, result negative: Secondary | ICD-10-CM | POA: Diagnosis not present

## 2023-06-02 DIAGNOSIS — R87810 Cervical high risk human papillomavirus (HPV) DNA test positive: Secondary | ICD-10-CM

## 2023-06-02 DIAGNOSIS — R87611 Atypical squamous cells cannot exclude high grade squamous intraepithelial lesion on cytologic smear of cervix (ASC-H): Secondary | ICD-10-CM

## 2023-06-02 HISTORY — PX: COLPOSCOPY VULVA W/ BIOPSY: SUR282

## 2023-06-02 LAB — POCT URINE PREGNANCY: Preg Test, Ur: NEGATIVE

## 2023-06-02 NOTE — Progress Notes (Signed)
    Patient name: Marie Duncan MRN [id number]  Date of birth: 1981/04/06 Chief Complaint:   Colposcopy  History of Present Illness:   Marie Duncan is a 42 y.o. G3P3 female being seen today for cervical dysplasia management- pt with anal SCC- surgery scheduled for end of July.   Menses are every month- seem to be slightly heavy the first few days.  Taking tylenol as needed for dysmenorrhea.    Not sexually active currently- denies postcoital bleeding.  Denies irregular discharge itching or irritation.  Denies pelvic pain.  Quit tobacco in March New sexual partner:  No.   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     04/24/2023    8:42 AM 04/14/2023    8:28 AM  Depression screen PHQ 2/9  Decreased Interest 1 1  Down, Depressed, Hopeless 0 1  PHQ - 2 Score 1 2  Altered sleeping 1 0  Tired, decreased energy 1 1  Change in appetite 1 1  Feeling bad or failure about yourself  0 0  Trouble concentrating 0 1  Moving slowly or fidgety/restless 0 0  Suicidal thoughts 0 0  PHQ-9 Score 4 5     Review of Systems:   Pertinent items are noted in HPI Denies fever/chills, dizziness, headaches, visual disturbances, fatigue, shortness of breath, chest pain, abdominal pain, vomiting, no problems with bowel movements, urination, or intercourse unless otherwise stated above.  Pertinent History Reviewed:  Reviewed past medical,surgical, social, obstetrical and family history.  Reviewed problem list, medications and allergies. Physical Assessment:   Vitals:   06/02/23 1032  BP: 123/76  Pulse: 74  Weight: 190 lb (86.2 kg)  Height: 5\' 7"  (1.702 m)  Body mass index is 29.76 kg/m.       Physical Examination:   General appearance: alert, well appearing, and in no distress  Psych: mood appropriate, normal affect  Skin: warm & dry   Cardiovascular: normal heart rate noted  Respiratory: normal respiratory effort, no  distress  Abdomen: soft, non-tender   Pelvic: VULVA: normal appearing vulva with no masses, tenderness or lesions, VAGINA: normal appearing vagina with normal color and discharge, no lesions, CERVIX: normal appearing cervix without discharge or lesions- see colposcopy section  Extremities: no edema   Chaperone: Faith Rogue     Colposcopy Procedure Note  Indications: ASC-H, HPV+    Procedure Details  The risks and benefits of the procedure and Written informed consent obtained.  Speculum placed in vagina and excellent visualization of cervix achieved, cervix swabbed x 3 with acetic acid solution.  Findings:  Adequate colposcopy is noted today.  TMZ zone present  Cervix: Considerable acetowhite changes notes, no mosaicism, no discrete lesion; ECC and cervical biopsies obtained.    Monsel's applied.  Adequate hemostasis noted  Specimens: ECC, cervical biopsies  Complications: none.  Colposcopic Impression: CIN2   Plan(Based on 2019 ASCCP recommendations)  -Discussed HPV- reviewed incidence and its potential to cause condylomas to dysplasia to cervical cancer -Reviewed degree of abnormal pap smears  -Discussed ASCCP guidelines and current recommendations for colposcopy -As above, inform consent obtained and procedure completed -biopsies obtained, further management pending results -Questions and concerns were addressed  [name], DO Attending Obstetrician & Gynecologist, [facility] for [facility], [facility]

## 2023-06-03 LAB — SURGICAL PATHOLOGY

## 2023-06-10 NOTE — Patient Instructions (Signed)
DUE TO COVID-19 ONLY TWO VISITORS  (aged 43 and older)  ARE ALLOWED TO COME WITH YOU AND STAY IN THE WAITING ROOM ONLY DURING PRE OP AND PROCEDURE.   **NO VISITORS ARE ALLOWED IN THE SHORT STAY AREA OR RECOVERY ROOM!!**  IF YOU WILL BE ADMITTED INTO THE HOSPITAL YOU ARE ALLOWED ONLY FOUR SUPPORT PEOPLE DURING VISITATION HOURS ONLY (7 AM -8PM)   The support person(s) must pass our screening, gel in and out, and wear a mask at all times, including in the patient's room. Patients must also wear a mask when staff or their support person are in the room. Visitors GUEST BADGE MUST BE WORN VISIBLY  One adult visitor may remain with you overnight and MUST be in the room by 8 P.M.     Your procedure is scheduled on: 06/17/23   Report to [facility] Main Entrance    Report to admitting at : 1:30 PM   Call this number if you have problems the morning of surgery [phone]   Do not eat food :After Midnight.   After Midnight you may have the following liquids until : 12:30 PM DAY OF SURGERY  Water Black Coffee (sugar ok, NO MILK/CREAM OR CREAMERS)  Tea (sugar ok, NO MILK/CREAM OR CREAMERS) regular and decaf                             Plain Jell-O (NO RED)                                           Fruit ices (not with fruit pulp, NO RED)                                     Popsicles (NO RED)                                                                  Juice: apple, WHITE grape, WHITE cranberry Sports drinks like Gatorade (NO RED)               Oral Hygiene is also important to reduce your risk of infection.                                    Remember - BRUSH YOUR TEETH THE MORNING OF SURGERY WITH YOUR REGULAR TOOTHPASTE  DENTURES WILL BE REMOVED PRIOR TO SURGERY PLEASE DO NOT APPLY "Poly grip" OR ADHESIVES!!!   Do NOT smoke after Midnight   Take these medicines the morning of surgery with A SIP OF WATER: clindamycin.Tylenol as needed.                              You may not  have any metal on your body including hair pins, jewelry, and body piercing             Do not wear make-up, lotions, powders, perfumes/cologne, or deodorant  Do not wear nail polish including gel and S&S, artificial/acrylic nails, or any other type of covering on natural nails including finger and toenails. If you have artificial nails, gel coating, etc. that needs to be removed by a nail salon please have this removed prior to surgery or surgery may need to be canceled/ delayed if the surgeon/ anesthesia feels like they are unable to be safely monitored.   Do not shave  48 hours prior to surgery.    Do not bring valuables to the hospital. [place] IS NOT             RESPONSIBLE   FOR VALUABLES.   Contacts, glasses, or bridgework may not be worn into surgery.   Bring small overnight bag day of surgery.   DO NOT BRING YOUR HOME MEDICATIONS TO THE HOSPITAL. PHARMACY WILL DISPENSE MEDICATIONS LISTED ON YOUR MEDICATION LIST TO YOU DURING YOUR ADMISSION IN THE HOSPITAL!    Patients discharged on the day of surgery will not be allowed to drive home.  Someone NEEDS to stay with you for the first 24 hours after anesthesia.   Special Instructions: Bring a copy of your healthcare power of attorney and living will documents         the day of surgery if you haven't scanned them before.              Please read over the following fact sheets you were given: IF YOU HAVE QUESTIONS ABOUT YOUR PRE-OP INSTRUCTIONS PLEASE CALL [phone]    [facility] - Preparing for Surgery Before surgery, you can play an important role.  Because skin is not sterile, your skin needs to be as free of germs as possible.  You can reduce the number of germs on your skin by washing with CHG (chlorahexidine gluconate) soap before surgery.  CHG is an antiseptic cleaner which kills germs and bonds with the skin to continue killing germs even after washing. Please DO NOT use if you have an allergy to CHG or antibacterial  soaps.  If your skin becomes reddened/irritated stop using the CHG and inform your nurse when you arrive at Short Stay. Do not shave (including legs and underarms) for at least 48 hours prior to the first CHG shower.  You may shave your face/neck. Please follow these instructions carefully:  1.  Shower with CHG Soap the night before surgery and the  morning of Surgery.  2.  If you choose to wash your hair, wash your hair first as usual with your  normal  shampoo.  3.  After you shampoo, rinse your hair and body thoroughly to remove the  shampoo.                           4.  Use CHG as you would any other liquid soap.  You can apply chg directly  to the skin and wash                       Gently with a scrungie or clean washcloth.  5.  Apply the CHG Soap to your body ONLY FROM THE NECK DOWN.   Do not use on face/ open                           Wound or open sores. Avoid contact with eyes, ears mouth and genitals (private parts).  Wash face,  Genitals (private parts) with your normal soap.             6.  Wash thoroughly, paying special attention to the area where your surgery  will be performed.  7.  Thoroughly rinse your body with warm water from the neck down.  8.  DO NOT shower/wash with your normal soap after using and rinsing off  the CHG Soap.                9.  Pat yourself dry with a clean towel.            10.  Wear clean pajamas.            11.  Place clean sheets on your bed the night of your first shower and do not  sleep with pets. Day of Surgery : Do not apply any lotions/deodorants the morning of surgery.  Please wear clean clothes to the hospital/surgery center.  FAILURE TO FOLLOW THESE INSTRUCTIONS MAY RESULT IN THE CANCELLATION OF YOUR SURGERY PATIENT SIGNATURE_________________________________  NURSE SIGNATURE__________________________________  ________________________________________________________________________

## 2023-06-10 NOTE — Telephone Encounter (Signed)
Called patient to review recent colposcopy report  Pt aware that next step is to r

## 2023-06-11 ENCOUNTER — Encounter (HOSPITAL_COMMUNITY): Payer: Self-pay

## 2023-06-11 ENCOUNTER — Encounter (HOSPITAL_COMMUNITY)
Admission: RE | Admit: 2023-06-11 | Discharge: 2023-06-11 | Disposition: A | Discharge status: Home / Self Care | Payer: 59 | Source: Ambulatory Visit | Attending: General Surgery | Admitting: General Surgery

## 2023-06-11 ENCOUNTER — Other Ambulatory Visit: Payer: Self-pay

## 2023-06-11 VITALS — BP 127/78 | HR 76 | Temp 98.1°F | Ht 67.0 in | Wt 189.0 lb

## 2023-06-11 DIAGNOSIS — I2510 Atherosclerotic heart disease of native coronary artery without angina pectoris: Secondary | ICD-10-CM | POA: Insufficient documentation

## 2023-06-11 DIAGNOSIS — Z01818 Encounter for other preprocedural examination: Secondary | ICD-10-CM | POA: Diagnosis present

## 2023-06-11 HISTORY — DX: Cardiac arrhythmia, unspecified: I49.9

## 2023-06-11 LAB — CBC
HCT: 40.2 % (ref 36.0–46.0)
Hemoglobin: 13.6 g/dL (ref 12.0–15.0)
MCH: 33.2 pg (ref 26.0–34.0)
MCHC: 33.8 g/dL (ref 30.0–36.0)
MCV: 98 fL (ref 80.0–100.0)
Platelets: 303 10*3/uL (ref 150–400)
RBC: 4.1 MIL/uL (ref 3.87–5.11)
RDW: 11.7 % (ref 11.5–15.5)
WBC: 8.8 10*3/uL (ref 4.0–10.5)
nRBC: 0 % (ref 0.0–0.2)

## 2023-06-11 LAB — BASIC METABOLIC PANEL
Anion gap: 10 (ref 5–15)
BUN: 8 mg/dL (ref 6–20)
CO2: 21 mmol/L — ABNORMAL LOW (ref 22–32)
Calcium: 9.4 mg/dL (ref 8.9–10.3)
Chloride: 107 mmol/L (ref 98–111)
Creatinine, Ser: 0.73 mg/dL (ref 0.44–1.00)
GFR, Estimated: >60 mL/min (ref >60)
Glucose, Bld: 85 mg/dL (ref 70–99)
Potassium: 3.8 mmol/L (ref 3.5–5.1)
Sodium: 138 mmol/L (ref 135–145)

## 2023-06-11 NOTE — Progress Notes (Signed)
  Bowel prep reminder:  PCP - Elfredia Nevins, MD Cardiologist - N/A  Chest x-ray - CT Chest: 04/28/23 EKG - 06/11/23  Stress Test -  ECHO -  Cardiac Cath -  Pacemaker/ICD device last checked:  Sleep Study - N/A CPAP -   Fasting Blood Sugar - N/A Checks Blood Sugar _____ times a day  Blood Thinner Instructions: N/A Aspirin Instructions: Last Dose:  Anesthesia review: Hx: Palpitations.  Patient denies shortness of breath, fever, cough and chest pain at PAT appointment  Patient verbalized understanding of instructions that were given to them at the PAT appointment. Patient was also instructed that they will need to review over the PAT instructions again at home before surgery.

## 2023-06-16 ENCOUNTER — Encounter: Payer: Self-pay | Admitting: Obstetrics & Gynecology

## 2023-06-17 ENCOUNTER — Ambulatory Visit (HOSPITAL_COMMUNITY)
Admission: RE | Admit: 2023-06-17 | Discharge: 2023-06-17 | Disposition: A | Discharge status: Home / Self Care | Payer: 59 | Attending: General Surgery | Admitting: General Surgery

## 2023-06-17 ENCOUNTER — Other Ambulatory Visit: Payer: Self-pay

## 2023-06-17 ENCOUNTER — Ambulatory Visit (HOSPITAL_BASED_OUTPATIENT_CLINIC_OR_DEPARTMENT_OTHER): Payer: 59 | Admitting: Anesthesiology

## 2023-06-17 ENCOUNTER — Encounter (HOSPITAL_COMMUNITY)
Admission: RE | Disposition: A | Discharge status: Home / Self Care | Payer: Self-pay | Source: Home / Self Care | Attending: General Surgery

## 2023-06-17 ENCOUNTER — Ambulatory Visit (HOSPITAL_COMMUNITY): Payer: 59 | Admitting: Physician Assistant

## 2023-06-17 ENCOUNTER — Encounter (HOSPITAL_COMMUNITY): Payer: Self-pay | Admitting: General Surgery

## 2023-06-17 DIAGNOSIS — Z87891 Personal history of nicotine dependence: Secondary | ICD-10-CM | POA: Diagnosis not present

## 2023-06-17 DIAGNOSIS — C211 Malignant neoplasm of anal canal: Secondary | ICD-10-CM | POA: Insufficient documentation

## 2023-06-17 DIAGNOSIS — C44520 Squamous cell carcinoma of anal skin: Secondary | ICD-10-CM

## 2023-06-17 DIAGNOSIS — Z01818 Encounter for other preprocedural examination: Secondary | ICD-10-CM

## 2023-06-17 HISTORY — PX: MASS EXCISION: SHX2000

## 2023-06-17 HISTORY — DX: Palpitations: R00.2

## 2023-06-17 LAB — POCT PREGNANCY, URINE: Preg Test, Ur: NEGATIVE

## 2023-06-17 SURGERY — EXCISION MASS
Anesthesia: General

## 2023-06-17 MED ORDER — SODIUM CHLORIDE 0.9 % IV SOLN: 250.0000 mL | INTRAVENOUS | Status: DC | PRN

## 2023-06-17 MED ORDER — ONDANSETRON HCL 4 MG/2ML IJ SOLN: 4.0000 mg | Freq: Once | INTRAMUSCULAR | Status: DC | PRN

## 2023-06-17 MED ORDER — ACETAMINOPHEN 160 MG/5ML PO SOLN: 325.0000 mg | ORAL | Status: DC | PRN

## 2023-06-17 MED ORDER — DEXAMETHASONE SODIUM PHOSPHATE 10 MG/ML IJ SOLN
INTRAMUSCULAR | Status: AC
  Filled 2023-06-17: qty 1

## 2023-06-17 MED ORDER — PROPOFOL 10 MG/ML IV BOLUS
INTRAVENOUS | Status: AC
  Filled 2023-06-17: qty 20

## 2023-06-17 MED ORDER — MIDAZOLAM HCL 2 MG/2ML IJ SOLN
INTRAMUSCULAR | Status: AC
  Filled 2023-06-17: qty 2

## 2023-06-17 MED ORDER — ONDANSETRON HCL 4 MG/2ML IJ SOLN
INTRAMUSCULAR | Status: DC | PRN
  Administered 2023-06-17: 4 mg via INTRAVENOUS

## 2023-06-17 MED ORDER — FENTANYL CITRATE (PF) 100 MCG/2ML IJ SOLN
INTRAMUSCULAR | Status: AC
  Filled 2023-06-17: qty 2

## 2023-06-17 MED ORDER — BUPIVACAINE LIPOSOME 1.3 % IJ SUSP
INTRAMUSCULAR | Status: AC
  Filled 2023-06-17: qty 20

## 2023-06-17 MED ORDER — BUPIVACAINE LIPOSOME 1.3 % IJ SUSP
INTRAMUSCULAR | Status: DC | PRN
  Administered 2023-06-17: 20 mL

## 2023-06-17 MED ORDER — CHLORHEXIDINE GLUCONATE 0.12 % MT SOLN
15.0000 mL | OROMUCOSAL | Status: AC
  Administered 2023-06-17: 15 mL via OROMUCOSAL

## 2023-06-17 MED ORDER — SUGAMMADEX SODIUM 200 MG/2ML IV SOLN
INTRAVENOUS | Status: DC | PRN
  Administered 2023-06-17: 240 mg via INTRAVENOUS

## 2023-06-17 MED ORDER — GABAPENTIN 300 MG PO CAPS
300.0000 mg | ORAL_CAPSULE | ORAL | Status: AC
  Administered 2023-06-17: 300 mg via ORAL
  Filled 2023-06-17: qty 1

## 2023-06-17 MED ORDER — ESMOLOL HCL 100 MG/10ML IV SOLN
INTRAVENOUS | Status: DC | PRN
  Administered 2023-06-17: 30 mg via INTRAVENOUS

## 2023-06-17 MED ORDER — FENTANYL CITRATE (PF) 100 MCG/2ML IJ SOLN
INTRAMUSCULAR | Status: DC | PRN
  Administered 2023-06-17 (×3): 50 ug via INTRAVENOUS

## 2023-06-17 MED ORDER — DIPHENHYDRAMINE HCL 50 MG/ML IJ SOLN
INTRAMUSCULAR | Status: DC | PRN
  Administered 2023-06-17: 25 mg via INTRAVENOUS

## 2023-06-17 MED ORDER — FENTANYL CITRATE PF 50 MCG/ML IJ SOSY: 25.0000 ug | PREFILLED_SYRINGE | INTRAMUSCULAR | Status: DC | PRN

## 2023-06-17 MED ORDER — BUPIVACAINE-EPINEPHRINE (PF) 0.5% -1:200000 IJ SOLN
INTRAMUSCULAR | Status: DC | PRN
  Administered 2023-06-17: 30 mL

## 2023-06-17 MED ORDER — DEXMEDETOMIDINE HCL IN NACL 80 MCG/20ML IV SOLN
INTRAVENOUS | Status: DC | PRN
  Administered 2023-06-17: 12 ug via INTRAVENOUS

## 2023-06-17 MED ORDER — BUPIVACAINE HCL 0.25 % IJ SOLN
INTRAMUSCULAR | Status: AC
  Filled 2023-06-17: qty 1

## 2023-06-17 MED ORDER — OXYCODONE HCL 5 MG PO TABS: 5.0000 mg | ORAL_TABLET | Freq: Once | ORAL | Status: DC | PRN

## 2023-06-17 MED ORDER — DEXAMETHASONE SODIUM PHOSPHATE 4 MG/ML IJ SOLN
INTRAMUSCULAR | Status: DC | PRN
Start: 2023-06-17 — End: 2023-06-17
  Administered 2023-06-17: 5 mg via INTRAVENOUS

## 2023-06-17 MED ORDER — OXYCODONE HCL 5 MG/5ML PO SOLN: 5.0000 mg | Freq: Once | ORAL | Status: DC | PRN

## 2023-06-17 MED ORDER — SODIUM CHLORIDE 0.9% FLUSH: 3.0000 mL | Freq: Two times a day (BID) | INTRAVENOUS | Status: DC

## 2023-06-17 MED ORDER — BUPIVACAINE-EPINEPHRINE (PF) 0.5% -1:200000 IJ SOLN
INTRAMUSCULAR | Status: AC
  Filled 2023-06-17: qty 30

## 2023-06-17 MED ORDER — ACETAMINOPHEN 500 MG PO TABS: 1000.0000 mg | ORAL_TABLET | ORAL | Status: DC

## 2023-06-17 MED ORDER — OXYCODONE HCL 5 MG PO TABS: 5.0000 mg | ORAL_TABLET | ORAL | Status: DC | PRN

## 2023-06-17 MED ORDER — ONDANSETRON HCL 4 MG/2ML IJ SOLN
INTRAMUSCULAR | Status: AC
  Filled 2023-06-17: qty 2

## 2023-06-17 MED ORDER — ROCURONIUM BROMIDE 100 MG/10ML IV SOLN
INTRAVENOUS | Status: DC | PRN
  Administered 2023-06-17: 40 mg via INTRAVENOUS

## 2023-06-17 MED ORDER — PROPOFOL 10 MG/ML IV BOLUS
INTRAVENOUS | Status: DC | PRN
Start: 2023-06-17 — End: 2023-06-17
  Administered 2023-06-17: 200 mg via INTRAVENOUS

## 2023-06-17 MED ORDER — ACETAMINOPHEN 500 MG PO TABS
1000.0000 mg | ORAL_TABLET | ORAL | Status: AC
  Administered 2023-06-17: 1000 mg via ORAL
  Filled 2023-06-17: qty 2

## 2023-06-17 MED ORDER — LACTATED RINGERS IV SOLN: INTRAVENOUS | Status: DC | PRN

## 2023-06-17 MED ORDER — 0.9 % SODIUM CHLORIDE (POUR BTL) OPTIME
TOPICAL | Status: DC | PRN
  Administered 2023-06-17: 1000 mL

## 2023-06-17 MED ORDER — CELECOXIB 200 MG PO CAPS
200.0000 mg | ORAL_CAPSULE | ORAL | Status: AC
  Administered 2023-06-17: 200 mg via ORAL
  Filled 2023-06-17: qty 1

## 2023-06-17 MED ORDER — MEPERIDINE HCL 50 MG/ML IJ SOLN: 6.2500 mg | INTRAMUSCULAR | Status: DC | PRN

## 2023-06-17 MED ORDER — ACETAMINOPHEN 650 MG RE SUPP: 650.0000 mg | RECTAL | Status: DC | PRN

## 2023-06-17 MED ORDER — ACETAMINOPHEN 325 MG PO TABS: 650.0000 mg | ORAL_TABLET | ORAL | Status: DC | PRN

## 2023-06-17 MED ORDER — MIDAZOLAM HCL 5 MG/5ML IJ SOLN
INTRAMUSCULAR | Status: DC | PRN
  Administered 2023-06-17: 2 mg via INTRAVENOUS

## 2023-06-17 MED ORDER — BUPIVACAINE LIPOSOME 1.3 % IJ SUSP: 20.0000 mL | Freq: Once | INTRAMUSCULAR | Status: DC

## 2023-06-17 MED ORDER — OXYCODONE HCL 5 MG PO TABS
5.0000 mg | ORAL_TABLET | Freq: Four times a day (QID) | ORAL | 0 refills | Status: DC | PRN
Start: 2023-06-17 — End: 2024-01-21

## 2023-06-17 MED ORDER — ACETAMINOPHEN 325 MG PO TABS: 325.0000 mg | ORAL_TABLET | ORAL | Status: DC | PRN

## 2023-06-17 MED ORDER — LIDOCAINE HCL (PF) 1 % IJ SOLN
INTRAMUSCULAR | Status: AC
  Filled 2023-06-17: qty 30

## 2023-06-17 MED ORDER — SODIUM CHLORIDE 0.9% FLUSH: 3.0000 mL | INTRAVENOUS | Status: DC | PRN

## 2023-06-17 MED ORDER — LACTATED RINGERS IV SOLN: INTRAVENOUS | Status: DC

## 2023-06-17 SURGICAL SUPPLY — 39 items
ADH SKN CLS APL DERMABOND .7 (GAUZE/BANDAGES/DRESSINGS) ×1
APL PRP STRL LF DISP 70% ISPRP (MISCELLANEOUS) ×1
APL SKNCLS STERI-STRIP NONHPOA (GAUZE/BANDAGES/DRESSINGS)
BAG COUNTER SPONGE SURGICOUNT (BAG) IMPLANT
BAG SPNG CNTER NS LX DISP (BAG)
BENZOIN TINCTURE PRP APPL 2/3 (GAUZE/BANDAGES/DRESSINGS) IMPLANT
BLADE SURG 15 STRL LF DISP TIS (BLADE) ×2 IMPLANT
BLADE SURG 15 STRL SS (BLADE) ×1
CHLORAPREP W/TINT 26 (MISCELLANEOUS) ×2 IMPLANT
COVER BACK TABLE 60X90IN (DRAPES) ×2 IMPLANT
DERMABOND ADVANCED .7 DNX12 (GAUZE/BANDAGES/DRESSINGS) ×2 IMPLANT
DRAPE LAPAROTOMY T 98X78 PEDS (DRAPES) ×2 IMPLANT
DRSG TEGADERM 4X4.75 (GAUZE/BANDAGES/DRESSINGS) IMPLANT
ELECT REM PT RETURN 15FT ADLT (MISCELLANEOUS) ×2 IMPLANT
GAUZE PAD ABD 7.5X8 STRL (GAUZE/BANDAGES/DRESSINGS) IMPLANT
GAUZE SPONGE 4X4 12PLY STRL (GAUZE/BANDAGES/DRESSINGS) IMPLANT
GLOVE BIO SURGEON STRL SZ 6.5 (GLOVE) ×2 IMPLANT
GLOVE INDICATOR 6.5 STRL GRN (GLOVE) ×4 IMPLANT
GOWN L4 XXLG W/PAP TWL (GOWN DISPOSABLE) ×2 IMPLANT
KIT TURNOVER KIT A (KITS) IMPLANT
NDL HYPO 25X1 1.5 SAFETY (NEEDLE) ×2 IMPLANT
NEEDLE HYPO 25X1 1.5 SAFETY (NEEDLE) ×1
NS IRRIG 1000ML POUR BTL (IV SOLUTION) IMPLANT
PENCIL SMOKE EVACUATOR (MISCELLANEOUS) IMPLANT
SPIKE FLUID TRANSFER (MISCELLANEOUS) IMPLANT
STRIP CLOSURE SKIN 1/2X4 (GAUZE/BANDAGES/DRESSINGS) IMPLANT
SUT CHROMIC 2 0 SH (SUTURE) IMPLANT
SUT ETHILON 4 0 PS 2 18 (SUTURE) IMPLANT
SUT MNCRL AB 4-0 PS2 18 (SUTURE) ×2 IMPLANT
SUT SILK 2 0 SH (SUTURE) IMPLANT
SUT VIC AB 2-0 SH 27 (SUTURE) ×1
SUT VIC AB 2-0 SH 27X BRD (SUTURE) IMPLANT
SUT VIC AB 3-0 SH 27 (SUTURE) ×1
SUT VIC AB 3-0 SH 27XBRD (SUTURE) IMPLANT
SYR CONTROL 10ML LL (SYRINGE) ×2 IMPLANT
TOWEL OR 17X26 10 PK STRL BLUE (TOWEL DISPOSABLE) ×2 IMPLANT
TOWEL OR NON WOVEN STRL DISP B (DISPOSABLE) ×2 IMPLANT
UNDERPAD 30X36 HEAVY ABSORB (UNDERPADS AND DIAPERS) IMPLANT
YANKAUER SUCT BULB TIP NO VENT (SUCTIONS) IMPLANT

## 2023-06-17 NOTE — Transfer of Care (Signed)
Immediate Anesthesia Transfer of Care Note  Patient: Marie Duncan  Procedure(s) Performed: EXCISION ANAL MASS  Patient Location: PACU  Anesthesia Type:General  Level of Consciousness: awake and alert   Airway & Oxygen Therapy: Patient Spontanous Breathing and Patient connected to face mask oxygen  Post-op Assessment: Report given to RN and Post -op Vital signs reviewed and stable  Post vital signs: Reviewed and stable  Last Vitals:  Vitals Value Taken Time  BP 109/59 06/17/23 1738  Temp    Pulse 57 06/17/23 1743  Resp 18 06/17/23 1743  SpO2 99 % 06/17/23 1743  Vitals shown include unfiled device data.  Last Pain:  Vitals:   06/17/23 1411  TempSrc:   PainSc: 0-No pain      Patients Stated Pain Goal: 4 (06/17/23 1411)  Complications: No notable events documented.

## 2023-06-17 NOTE — Interval H&P Note (Signed)
History and Physical Interval Note:  06/17/2023 2:54 PM  Marie Duncan  has presented today for surgery, with the diagnosis of anal squamous cell carcinoma.  The various methods of treatment have been discussed with the patient and family. After consideration of risks, benefits and other options for treatment, the patient has consented to  Procedure(s): EXCISION ANAL MASS (N/A) as a surgical intervention.  The patient's history has been reviewed, patient examined, no change in status, stable for surgery.  I have reviewed the patient's chart and labs.  Questions were answered to the patient's satisfaction.     [name], MD  Colorectal and General Surgery [facility]

## 2023-06-17 NOTE — Anesthesia Postprocedure Evaluation (Signed)
Anesthesia Post Note  Patient: Marie Duncan  Procedure(s) Performed: EXCISION ANAL MASS     Patient location during evaluation: PACU Anesthesia Type: General Level of consciousness: awake and alert Pain management: pain level controlled Vital Signs Assessment: post-procedure vital signs reviewed and stable Respiratory status: spontaneous breathing, nonlabored ventilation, respiratory function stable and patient connected to nasal cannula oxygen Cardiovascular status: blood pressure returned to baseline and stable Postop Assessment: no apparent nausea or vomiting Anesthetic complications: no   No notable events documented.  Last Vitals:  Vitals:   06/17/23 1745 06/17/23 1800  BP: 119/81 126/72  Pulse: 64 77  Resp: 19 15  Temp:    SpO2: 97% 99%    Last Pain:  Vitals:   06/17/23 1800  TempSrc:   PainSc: 0-No pain                 Dorean Daniello

## 2023-06-17 NOTE — Anesthesia Procedure Notes (Signed)
Procedure Name: Intubation Date/Time: 06/17/2023 5:00 PM  Performed by: Deri Fuelling, CRNAPre-anesthesia Checklist: Patient identified, Emergency Drugs available, Suction available and Patient being monitored Patient Re-evaluated:Patient Re-evaluated prior to induction Oxygen Delivery Method: Circle system utilized Preoxygenation: Pre-oxygenation with 100% oxygen Induction Type: IV induction Ventilation: Mask ventilation without difficulty Laryngoscope Size: Mac and 3 Grade View: Grade I Tube type: Oral Tube size: 7.0 mm Number of attempts: 1 Airway Equipment and Method: Stylet and Oral airway Placement Confirmation: ETT inserted through vocal cords under direct vision, positive ETCO2 and breath sounds checked- equal and bilateral Secured at: 21 cm Tube secured with: Tape Dental Injury: Teeth and Oropharynx as per pre-operative assessment

## 2023-06-17 NOTE — Discharge Instructions (Addendum)

## 2023-06-17 NOTE — Anesthesia Preprocedure Evaluation (Signed)
Anesthesia Evaluation  Patient identified by MRN, date of birth, ID band Patient awake    Reviewed: Allergy & Precautions, H&P , NPO status , Patient's Chart, lab work & pertinent test results, reviewed documented beta blocker date and time   Airway Mallampati: II  TM Distance: >3 FB Neck ROM: full    Dental no notable dental hx. (+) Teeth Intact, Dental Advisory Given   Pulmonary neg pulmonary ROS, former smoker   Pulmonary exam normal breath sounds clear to auscultation       Cardiovascular Exercise Tolerance: Good negative cardio ROS  Rhythm:regular Rate:Normal     Neuro/Psych   Anxiety     negative neurological ROS  negative psych ROS   GI/Hepatic negative GI ROS, Neg liver ROS,,,  Endo/Other  negative endocrine ROS    Renal/GU negative Renal ROS  negative genitourinary   Musculoskeletal   Abdominal   Peds  Hematology negative hematology ROS (+)   Anesthesia Other Findings   Reproductive/Obstetrics negative OB ROS                             Anesthesia Physical Anesthesia Plan  ASA: 2  Anesthesia Plan: General   Post-op Pain Management: Minimal or no pain anticipated   Induction: Intravenous  PONV Risk Score and Plan: 3 and Ondansetron, Dexamethasone and Treatment may vary due to age or medical condition  Airway Management Planned: Oral ETT and LMA  Additional Equipment:   Intra-op Plan:   Post-operative Plan:   Informed Consent: I have reviewed the patients History and Physical, chart, labs and discussed the procedure including the risks, benefits and alternatives for the proposed anesthesia with the patient or authorized representative who has indicated his/her understanding and acceptance.     Dental Advisory Given  Plan Discussed with: CRNA  Anesthesia Plan Comments:        Anesthesia Quick Evaluation

## 2023-06-17 NOTE — Op Note (Addendum)
06/17/2023  5:14 PM  PATIENT:  Marie Duncan  42 y.o. female  Patient Care Team: Elfredia Nevins, MD as PCP - General (Internal Medicine) Doreatha Massed, MD as Medical Oncologist (Medical Oncology) Therese Sarah, RN as Oncology Nurse Navigator (Medical Oncology)  PRE-OPERATIVE DIAGNOSIS:  ANAL MARGIN SQUAMOUS CELL CARCINOMA  POST-OPERATIVE DIAGNOSIS:  ANAL MARGIN SQUAMOUS CELL CARCINOMA  PROCEDURE:  EXCISION PERIANAL MASS   Surgeon(s): Romie Levee, MD  ASSISTANT: Clabe Seal, MD   ANESTHESIA:   local and general  SPECIMEN:  Source of Specimen:  L lateral perianal mass  DISPOSITION OF SPECIMEN:  PATHOLOGY  COUNTS:  YES  PLAN OF CARE: Discharge to home after PACU  PATIENT DISPOSITION:  PACU - hemodynamically stable.  INDICATION:  Patient has had a mass in her perianal region since December. She was seen by her doctor and biopsy was obtained. This showed squamous cell carcinoma. CT scan of the chest abdomen and pelvis showed a possible lesion in the liver but no signs of lymphadenopathy.    OR FINDINGS: 3.5 x 2.3 cm left lateral perianal mass  DESCRIPTION: the patient was identified in the preoperative holding area and taken to the OR where they were laid on the operating room table.  General anesthesia was induced without difficulty. The patient was then positioned in prone jackknife position with buttocks gently taped apart.  The patient was then prepped and draped in usual sterile fashion.  SCDs were noted to be in place prior to the initiation of anesthesia. A surgical timeout was performed indicating the correct patient, procedure, positioning and need for preoperative antibiotics.  A rectal block was performed using Marcaine with epinephrine mixed with Experel.    I began with a digital rectal exam.   There were no internal lesions noted.  I then placed a Hill-Ferguson anoscope into the anal canal and evaluated this completely.  The patient had an anal  margin tumor that extended to the level of her anal canal but not into the anal canal on the left lateral side.  There did not appear to be any deep sphincter involvement.  5 mm margins were marked and an incision was made around the tumor using electrocautery.  Dissection was carried underneath the tumor into the subcutaneous layer also using electrocautery.  The sphincter complex on the medial border of the tumor was preserved as much as possible.  Once the entire specimen was removed, it was marked and oriented and sent to pathology for further examination.  Hemostasis was achieved using cautery.  We then began to close the incision dermal layer using interrupted 2-0 Vicryl suture.  Interrupted 3-0 Vicryl suture was used to fill in the gaps in between these.  2-0 chromic suture was then used to close the skin in interrupted fashion.  A sterile dressing was applied.  The remainder of the Marcaine/Exparel mixture was injected for postoperative pain control.  I was personally present during the key and critical portions of this procedure and immediately available throughout the entire procedure, as documented in my operative note.  A dressing was applied.  The patient was then awakened from anesthesia and sent to the postanesthesia care unit in stable condition.  All counts were correct per operating room staff.  [name], MD  Colorectal and General Surgery [facility]

## 2023-06-18 ENCOUNTER — Encounter (HOSPITAL_COMMUNITY): Payer: Self-pay | Admitting: General Surgery

## 2023-06-19 ENCOUNTER — Encounter (HOSPITAL_COMMUNITY): Payer: 59

## 2023-06-19 ENCOUNTER — Encounter (HOSPITAL_COMMUNITY): Admission: RE | Admit: 2023-06-19 | Payer: 59 | Source: Ambulatory Visit

## 2023-06-19 ENCOUNTER — Encounter (HOSPITAL_COMMUNITY): Payer: Self-pay

## 2023-06-19 VITALS — Ht 67.0 in | Wt 189.0 lb

## 2023-06-19 DIAGNOSIS — Z01818 Encounter for other preprocedural examination: Secondary | ICD-10-CM

## 2023-06-19 NOTE — OR Nursing (Signed)
Aware of new arrival time for procedure . Will arrive at 1130.

## 2023-06-23 DIAGNOSIS — Z01818 Encounter for other preprocedural examination: Secondary | ICD-10-CM

## 2023-06-23 DIAGNOSIS — R87613 High grade squamous intraepithelial lesion on cytologic smear of cervix (HGSIL): Secondary | ICD-10-CM

## 2023-06-24 ENCOUNTER — Inpatient Hospital Stay: Payer: 59 | Admitting: Hematology

## 2023-06-30 ENCOUNTER — Telehealth: Payer: Self-pay

## 2023-06-30 NOTE — Telephone Encounter (Signed)
Notified Patient of completion of Disability Form. Fax transmission confirmation received. Copy of form sent to [facility] for Patient to pick-up as requested. No other needs or concerns voiced at this time.

## 2023-07-01 ENCOUNTER — Encounter: Payer: 59 | Admitting: Obstetrics & Gynecology

## 2023-07-02 NOTE — Patient Instructions (Signed)
Marie Duncan  07/02/2023     @[email or web address] @   Your procedure is scheduled on 07/07/23.  Report to [facility] at 0900 A.M.  Call this number if you have problems the morning of surgery:  [phone]  If you experience any cold or flu symptoms such as cough, fever, chills, shortness of breath, etc. between now and your scheduled surgery, please notify us at the above number.   Remember:  Do not eat or drink after midnight.   Take these medicines the morning of surgery with A SIP OF WATER NONE    Do not wear jewelry, make-up or nail polish, including gel polish,  artificial nails, or any other type of covering on natural nails (fingers and  toes).  Do not wear lotions, powders, or perfumes, or deodorant.  Do not shave 48 hours prior to surgery.  Men may shave face and neck.  Do not bring valuables to the hospital.  [facility] is not responsible for any belongings or valuables.  Contacts, dentures or bridgework may not be worn into surgery.  Leave your suitcase in the car.  After surgery it may be brought to your room.  For patients admitted to the hospital, discharge time will be determined by your treatment team.  Patients discharged the day of surgery will not be allowed to drive home.   Name and phone number of your driver:   FAMILY Special instructions:    Please read over the following fact sheets that you were given. Anesthesia Post-op Instructions and Care and Recovery After Surgery      PATIENT INSTRUCTIONS POST-ANESTHESIA  IMMEDIATELY FOLLOWING SURGERY:  Do not drive or operate machinery for the first twenty four hours after surgery.  Do not make any important decisions for twenty four hours after surgery or while taking narcotic pain medications or sedatives.  If you develop intractable nausea and vomiting or a severe headache please notify your doctor immediately.  FOLLOW-UP:  Please make an appointment with your surgeon as instructed. You do not need  to follow up with anesthesia unless specifically instructed to do so.  WOUND CARE INSTRUCTIONS (if applicable):  Keep a dry clean dressing on the anesthesia/puncture wound site if there is drainage.  Once the wound has quit draining you may leave it open to air.  Generally you should leave the bandage intact for twenty four hours unless there is drainage.  If the epidural site drains for more than 36-48 hours please call the anesthesia department.  QUESTIONS?:  Please feel free to call your physician or the hospital operator if you have any questions, and they will be happy to assist you.      Cervical Laser Surgery Cervical laser surgery uses a focused beam of light to shrink, destroy, or remove tissue from the lowest part of the uterus (cervix). You may have this surgery if: You have abnormal cells (dysplasia) in your cervix that are an early sign of cancer. This is often found in abnormal results from a Pap test. Tissue from your cervix needs to be tested. This procedure is done in either a health care provider's office or an operating room. Tell a health care provider about: Any allergies you have. All medicines you are taking, including vitamins, herbs, eye drops, creams, and over-the-counter medicines. Any problems you or family members have had with anesthesia. Any bleeding problems you have. Any surgeries you have had. Any medical conditions you have. Whether you are pregnant or may be pregnant. What  are the risks? Your health care provider will talk with you about risks. These may include: Bleeding. Infection. Allergic reactions to medicines, dyes, or other solutions. Damage to nearby structures or organs. Narrowing of the cervix, also called cervical stenosis. This condition usually causes no symptoms, but in rare cases may cause painful periods, abnormal bleeding, or infertility. What happens before the procedure? Medicines Ask your provider about: Changing or stopping your  regular medicines. These include any diabetes medicines or blood thinners you take. Taking medicines such as aspirin and ibuprofen. These medicines can thin your blood. Do not take them unless your provider tells you to. Taking over-the-counter medicines, vitamins, herbs, and supplements. General instructions Follow instructions from your provider about what you may eat and drink. For at least 24 hours before the procedure: Do not have sex. Do not douche. Do not put anything in the vagina, including tampons, vaginal creams, or medicines. You may be asked to pee (urinate)and poop right before the procedure. What happens during the procedure? You will lie on your back with your feet raised in footrests (stirrups). A lubricated device called a speculum will be put into your vagina to hold it open. This helps your provider to better see the inside of the vagina and cervix. You will be given anesthesia. This keeps you from feeling pain. It will numb your cervix. You may feel cramping when the medicine is injected. A solution will be swabbed on your cervix to help your provider see the tissue. A magnifying device called a colposcope will be placed into your vagina. It will shine a light and allow your provider to see your vagina and cervix more closely. A thin tube called an endoscope will be inserted into your vagina. The endoscope will have a laser. Your provider will use the laser to shrink, destroy, or remove the tissue. A cotton swab soaked in solution may be used to remove any burned or destroyed tissue. A paste may also be applied to your cervix to stop the bleeding. The procedure may vary among providers and hospitals. What happens after the procedure? You may feel some pain or cramping for a few days. You may have some bleeding and brownish discharge. You should wear menstrual pads for this. You may be asked to limit your activities for a period of time. Do not put anything into your vagina  until your provider says it is okay. This information is not intended to replace advice given to you by your health care provider. Make sure you discuss any questions you have with your health care provider. Document Revised: 07/15/2022 Document Reviewed: 07/15/2022 Elsevier Patient Education  2024 [employer].

## 2023-07-03 ENCOUNTER — Encounter (HOSPITAL_COMMUNITY)
Admission: RE | Admit: 2023-07-03 | Discharge: 2023-07-03 | Disposition: A | Discharge status: Home / Self Care | Payer: 59 | Source: Ambulatory Visit | Attending: Obstetrics & Gynecology | Admitting: Obstetrics & Gynecology

## 2023-07-03 HISTORY — DX: Other complications of anesthesia, initial encounter: T88.59XA

## 2023-07-03 NOTE — Pre-Procedure Instructions (Signed)
Attempted pre-op phonecall. Left VM for her to call us back. 

## 2023-07-06 ENCOUNTER — Encounter (HOSPITAL_COMMUNITY): Payer: Self-pay

## 2023-07-06 ENCOUNTER — Other Ambulatory Visit: Payer: Self-pay

## 2023-07-06 NOTE — Pre-Procedure Instructions (Signed)
Attempted pre-op phone call again. Left VM for her to call us back and stressed the urgency to speak to her so that surgery wont be canceled.

## 2023-07-07 ENCOUNTER — Encounter (HOSPITAL_COMMUNITY): Payer: Self-pay | Admitting: Obstetrics & Gynecology

## 2023-07-07 ENCOUNTER — Encounter (HOSPITAL_COMMUNITY)
Admission: RE | Disposition: A | Discharge status: Home / Self Care | Payer: Self-pay | Source: Home / Self Care | Attending: Obstetrics & Gynecology

## 2023-07-07 ENCOUNTER — Ambulatory Visit (HOSPITAL_BASED_OUTPATIENT_CLINIC_OR_DEPARTMENT_OTHER): Payer: 59 | Admitting: Certified Registered"

## 2023-07-07 ENCOUNTER — Ambulatory Visit (HOSPITAL_COMMUNITY)
Admission: RE | Admit: 2023-07-07 | Discharge: 2023-07-07 | Disposition: A | Discharge status: Home / Self Care | Payer: 59 | Source: Home / Self Care | Attending: Obstetrics & Gynecology | Admitting: Obstetrics & Gynecology

## 2023-07-07 ENCOUNTER — Ambulatory Visit (HOSPITAL_COMMUNITY): Payer: Self-pay | Admitting: Certified Registered"

## 2023-07-07 DIAGNOSIS — Z87891 Personal history of nicotine dependence: Secondary | ICD-10-CM | POA: Insufficient documentation

## 2023-07-07 DIAGNOSIS — Z85048 Personal history of other malignant neoplasm of rectum, rectosigmoid junction, and anus: Secondary | ICD-10-CM | POA: Diagnosis not present

## 2023-07-07 DIAGNOSIS — R87613 High grade squamous intraepithelial lesion on cytologic smear of cervix (HGSIL): Secondary | ICD-10-CM | POA: Insufficient documentation

## 2023-07-07 DIAGNOSIS — D069 Carcinoma in situ of cervix, unspecified: Secondary | ICD-10-CM

## 2023-07-07 DIAGNOSIS — Z01818 Encounter for other preprocedural examination: Secondary | ICD-10-CM

## 2023-07-07 HISTORY — PX: CERVICAL ABLATION: SHX5771

## 2023-07-07 LAB — POCT PREGNANCY, URINE: Preg Test, Ur: NEGATIVE

## 2023-07-07 SURGERY — ABLATION, CERVIX
Anesthesia: General | Site: Vagina

## 2023-07-07 MED ORDER — CHLORHEXIDINE GLUCONATE 0.12 % MT SOLN: 15.0000 mL | Freq: Once | OROMUCOSAL | Status: DC

## 2023-07-07 MED ORDER — DIPHENHYDRAMINE HCL 50 MG/ML IJ SOLN
INTRAMUSCULAR | Status: DC | PRN
  Administered 2023-07-07: 25 mg via INTRAVENOUS

## 2023-07-07 MED ORDER — FENTANYL CITRATE (PF) 100 MCG/2ML IJ SOLN
INTRAMUSCULAR | Status: AC
  Filled 2023-07-07: qty 2

## 2023-07-07 MED ORDER — LACTATED RINGERS IV SOLN: INTRAVENOUS | Status: DC

## 2023-07-07 MED ORDER — LIDOCAINE 2% (20 MG/ML) 5 ML SYRINGE
INTRAMUSCULAR | Status: DC | PRN
  Administered 2023-07-07: 100 mg via INTRAVENOUS

## 2023-07-07 MED ORDER — ORAL CARE MOUTH RINSE: 15.0000 mL | Freq: Once | OROMUCOSAL | Status: DC

## 2023-07-07 MED ORDER — PROPOFOL 10 MG/ML IV BOLUS
INTRAVENOUS | Status: DC | PRN
  Administered 2023-07-07: 200 mg via INTRAVENOUS

## 2023-07-07 MED ORDER — ONDANSETRON HCL 4 MG/2ML IJ SOLN: 4.0000 mg | Freq: Once | INTRAMUSCULAR | Status: DC | PRN

## 2023-07-07 MED ORDER — DEXAMETHASONE SODIUM PHOSPHATE 4 MG/ML IJ SOLN
INTRAMUSCULAR | Status: DC | PRN
  Administered 2023-07-07: 5 mg via INTRAVENOUS

## 2023-07-07 MED ORDER — KETOROLAC TROMETHAMINE 30 MG/ML IJ SOLN
INTRAMUSCULAR | Status: DC | PRN
  Administered 2023-07-07: 30 mg via INTRAVENOUS

## 2023-07-07 MED ORDER — WATER FOR IRRIGATION, STERILE IR SOLN
Status: DC | PRN
  Administered 2023-07-07: 1000 mL

## 2023-07-07 MED ORDER — MIDAZOLAM HCL 5 MG/5ML IJ SOLN
INTRAMUSCULAR | Status: DC | PRN
  Administered 2023-07-07: 2 mg via INTRAVENOUS

## 2023-07-07 MED ORDER — BUPIVACAINE-EPINEPHRINE (PF) 0.5% -1:200000 IJ SOLN
INTRAMUSCULAR | Status: AC
  Filled 2023-07-07: qty 30

## 2023-07-07 MED ORDER — FERRIC SUBSULFATE 259 MG/GM EX SOLN
CUTANEOUS | Status: DC | PRN
  Administered 2023-07-07: 1

## 2023-07-07 MED ORDER — DEXMEDETOMIDINE HCL IN NACL 80 MCG/20ML IV SOLN
INTRAVENOUS | Status: AC
  Filled 2023-07-07: qty 20

## 2023-07-07 MED ORDER — ONDANSETRON HCL 4 MG/2ML IJ SOLN
INTRAMUSCULAR | Status: DC | PRN
Start: 2023-07-07 — End: 2023-07-07
  Administered 2023-07-07: 4 mg via INTRAVENOUS

## 2023-07-07 MED ORDER — DIPHENHYDRAMINE HCL 50 MG/ML IJ SOLN
INTRAMUSCULAR | Status: AC
  Filled 2023-07-07: qty 1

## 2023-07-07 MED ORDER — FERRIC SUBSULFATE 259 MG/GM EX SOLN
CUTANEOUS | Status: AC
  Filled 2023-07-07: qty 8

## 2023-07-07 MED ORDER — KETOROLAC TROMETHAMINE 30 MG/ML IJ SOLN
INTRAMUSCULAR | Status: AC
  Filled 2023-07-07: qty 1

## 2023-07-07 MED ORDER — GLYCOPYRROLATE PF 0.2 MG/ML IJ SOSY
PREFILLED_SYRINGE | INTRAMUSCULAR | Status: DC | PRN
  Administered 2023-07-07: .2 mg via INTRAVENOUS

## 2023-07-07 MED ORDER — PROPOFOL 10 MG/ML IV BOLUS
INTRAVENOUS | Status: AC
  Filled 2023-07-07: qty 20

## 2023-07-07 MED ORDER — FENTANYL CITRATE PF 50 MCG/ML IJ SOSY
25.0000 ug | PREFILLED_SYRINGE | INTRAMUSCULAR | Status: DC | PRN
  Administered 2023-07-07 (×2): 50 ug via INTRAVENOUS
  Filled 2023-07-07: qty 1

## 2023-07-07 MED ORDER — SODIUM CHLORIDE (PF) 0.9 % IJ SOLN
INTRAMUSCULAR | Status: AC
  Filled 2023-07-07: qty 10

## 2023-07-07 MED ORDER — OXYCODONE HCL 5 MG/5ML PO SOLN: 5.0000 mg | Freq: Once | ORAL | Status: DC | PRN

## 2023-07-07 MED ORDER — ACETIC ACID 5 % SOLN
Status: DC | PRN
  Administered 2023-07-07: 1 via TOPICAL

## 2023-07-07 MED ORDER — LIDOCAINE-EPINEPHRINE 0.5 %-1:200000 IJ SOLN
INTRAMUSCULAR | Status: DC | PRN
  Administered 2023-07-07: 20 mL

## 2023-07-07 MED ORDER — FENTANYL CITRATE (PF) 100 MCG/2ML IJ SOLN
INTRAMUSCULAR | Status: DC | PRN
  Administered 2023-07-07 (×2): 25 ug via INTRAVENOUS
  Administered 2023-07-07 (×2): 50 ug via INTRAVENOUS

## 2023-07-07 MED ORDER — MIDAZOLAM HCL 2 MG/2ML IJ SOLN
INTRAMUSCULAR | Status: AC
  Filled 2023-07-07: qty 2

## 2023-07-07 MED ORDER — OXYCODONE HCL 5 MG PO TABS: 5.0000 mg | ORAL_TABLET | Freq: Once | ORAL | Status: DC | PRN

## 2023-07-07 MED ORDER — DEXMEDETOMIDINE HCL IN NACL 80 MCG/20ML IV SOLN
INTRAVENOUS | Status: DC | PRN
  Administered 2023-07-07: 8 ug via INTRAVENOUS

## 2023-07-07 MED ORDER — FENTANYL CITRATE PF 50 MCG/ML IJ SOSY
PREFILLED_SYRINGE | INTRAMUSCULAR | Status: AC
  Filled 2023-07-07: qty 1

## 2023-07-07 SURGICAL SUPPLY — 26 items
APL FBRTP 16 NS LF PRCTSCP (MISCELLANEOUS) ×1
BAG HAMPER (MISCELLANEOUS) ×2 IMPLANT
CLOTH BEACON ORANGE TIMEOUT ST (SAFETY) ×4 IMPLANT
COVER LIGHT HANDLE STERIS (MISCELLANEOUS) ×8 IMPLANT
GAUZE 4X4 16PLY ~~LOC~~+RFID DBL (SPONGE) ×2 IMPLANT
GLOVE BIO SURGEON STRL SZ 6.5 (GLOVE) ×2 IMPLANT
GLOVE BIOGEL PI IND STRL 7.0 (GLOVE) ×6 IMPLANT
GOWN STRL REUS W/ TWL LRG LVL3 (GOWN DISPOSABLE) ×2 IMPLANT
GOWN STRL REUS W/TWL LRG LVL3 (GOWN DISPOSABLE) ×3 IMPLANT
KIT TURNOVER KIT A (KITS) ×2 IMPLANT
LASER FIBER 1000M SMARTSCOPE (Laser) ×2 IMPLANT
MANIFOLD NEPTUNE II (INSTRUMENTS) ×2 IMPLANT
MARKER SKIN DUAL TIP RULER LAB (MISCELLANEOUS) ×2 IMPLANT
NDL SPNL 22GX3.5 QUINCKE BK (NEEDLE) ×2 IMPLANT
NEEDLE SPNL 22GX3.5 QUINCKE BK (NEEDLE) ×1
PACK BASIC III (CUSTOM PROCEDURE TRAY) ×1
PACK SRG BSC III STRL LF ECLPS (CUSTOM PROCEDURE TRAY) ×2 IMPLANT
PAD ARMBOARD 7.5X6 YLW CONV (MISCELLANEOUS) ×2 IMPLANT
POSITIONER HEAD 8X9X4 ADT (SOFTGOODS) ×2 IMPLANT
SCOPETTES 8 STERILE (MISCELLANEOUS) ×2 IMPLANT
SET BASIN LINEN APH (SET/KITS/TRAYS/PACK) ×2 IMPLANT
SHEET LAVH (DRAPES) ×2 IMPLANT
SWAB PROCTOSCOPIC (MISCELLANEOUS) ×2 IMPLANT
TUBE CONNECTING 12X1/4 (SUCTIONS) ×2 IMPLANT
TUBING SMOKE EVAC CO2 (TUBING) ×2 IMPLANT
WATER STERILE IRR 1000ML POUR (IV SOLUTION) ×2 IMPLANT

## 2023-07-07 NOTE — Transfer of Care (Signed)
Immediate Anesthesia Transfer of Care Note  Patient: Marie Duncan  Procedure(s) Performed: CERVICAL ABLATION (Vagina )  Patient Location: PACU  Anesthesia Type:General  Level of Consciousness: drowsy  Airway & Oxygen Therapy: Patient Spontanous Breathing and Patient connected to face mask oxygen  Post-op Assessment: Report given to RN and Post -op Vital signs reviewed and stable  Post vital signs: Reviewed and stable  Last Vitals:  Vitals Value Taken Time  BP    Temp    Pulse    Resp    SpO2      Last Pain:  Vitals:   07/07/23 1013  PainSc: 0-No pain         Complications: No notable events documented.

## 2023-07-07 NOTE — Anesthesia Preprocedure Evaluation (Signed)
Anesthesia Evaluation  Patient identified by MRN, date of birth, ID band Patient awake    Reviewed: Allergy & Precautions, H&P , NPO status , Patient's Chart, lab work & pertinent test results, reviewed documented beta blocker date and time   History of Anesthesia Complications (+) history of anesthetic complications  Airway Mallampati: II  TM Distance: >3 FB Neck ROM: full    Dental no notable dental hx.    Pulmonary neg pulmonary ROS, former smoker   Pulmonary exam normal breath sounds clear to auscultation       Cardiovascular Exercise Tolerance: Good negative cardio ROS  Rhythm:regular Rate:Normal     Neuro/Psych   Anxiety     negative neurological ROS  negative psych ROS   GI/Hepatic negative GI ROS, Neg liver ROS,,,  Endo/Other  negative endocrine ROS    Renal/GU negative Renal ROS  negative genitourinary   Musculoskeletal   Abdominal   Peds  Hematology negative hematology ROS (+)   Anesthesia Other Findings   Reproductive/Obstetrics negative OB ROS                             Anesthesia Physical Anesthesia Plan  ASA: 2  Anesthesia Plan: General and General LMA   Post-op Pain Management:    Induction:   PONV Risk Score and Plan: Ondansetron  Airway Management Planned:   Additional Equipment:   Intra-op Plan:   Post-operative Plan:   Informed Consent: I have reviewed the patients History and Physical, chart, labs and discussed the procedure including the risks, benefits and alternatives for the proposed anesthesia with the patient or authorized representative who has indicated his/her understanding and acceptance.     Dental Advisory Given  Plan Discussed with: CRNA  Anesthesia Plan Comments:        Anesthesia Quick Evaluation

## 2023-07-07 NOTE — Discharge Instructions (Signed)
HOME INSTRUCTIONS  Please note any unusual or excessive bleeding, pain, swelling. Mild dizziness or drowsiness are normal for about 24 hours after surgery.   Shower when comfortable  Restrictions: No driving for 24 hours or while taking pain medications.  Activity:  Nothing in vagina (no tampons, douching, or intercourse) x 4 weeks; no tub baths for 4 weeks Vaginal spotting is expected but if your bleeding is heavy, period like,  please call the office   Diet:  You may return to your regular diet.  Do not eat large meals.  Eat small frequent meals throughout the day.  Continue to drink a good amount of water at least 6-8 glasses of water per day, hydration is very important for the healing process.  Pain Management: Take over the counter tylenol or ibuprofen as needed for pain.  You can either take one or alternate between the two medications for pain management.  You may also use a heating pack as needed.    Alcohol -- Avoid for 24 hours and while taking pain medications.  Nausea: Take sips of ginger ale or soda  Fever -- Call physician if temperature over 101 degrees  Follow up:  If you do not already have a follow up appointment scheduled, please call the office at [phone].  If you experience fever (a temperature greater than 100.4), pain unrelieved by pain medication, shortness of breath, swelling of a single leg, or any other symptoms which are concerning to you please the office immediately.  

## 2023-07-07 NOTE — H&P (Signed)
[facility]ne[facility] Attending History and Physical  Marie Duncan is a 42 y.o.who presents for scheduled cervical ablation due to high grade dysplasia.  In review, recent pap showed ASC-H with positive HPV.  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 Of note, pt was recently diagnosed with anal squamous cell carcinoma s/p resection on 7/31.  Today she notes no acute complaints or changes.  Denies any abnormal vaginal discharge, fevers, chills, sweats, dysuria, nausea, vomiting, other GI or GU symptoms or other general symptoms.  Past Medical History:  Diagnosis Date   Anal cancer (HCC) 03/2023   oncology--- dr Ellin Saba;  moderateraly differeentated SCC perianal region  s/p rectal bx 04-01-2023 by dr Marletta Lor   Anxiety    Complication of anesthesia    sts after rectal mass removal 7/31, she had a rash during surgery that lasted 3 days postop. She was given benadryl in the OR, but they are not sure which med caused the rash.   Intermittent palpitations    06-01-2023  per pt notices more in the evening ,  stated her pcp is aware, in past year since ,   not all the time , ekg done was normal   Irregular heart beats    Past Surgical History:  Procedure Laterality Date   COLONOSCOPY WITH PROPOFOL N/A 04/22/2023   Procedure: COLONOSCOPY WITH PROPOFOL;  Surgeon: Lanelle Bal, DO;  Location: [facility];  Service: Endoscopy;  Laterality: N/A;  1:15 pm, asa 2   COLPOSCOPY VULVA W/ BIOPSY  06/02/2023   MASS EXCISION N/A 06/17/2023   Procedure: EXCISION ANAL MASS;  Surgeon: Romie Levee, MD;  Location: [facility];  Service: General;  Laterality: N/A;   POLYPECTOMY  04/22/2023   Procedure: POLYPECTOMY;  Surgeon: Lanelle Bal, DO;  Location: [facility];  Service: Endoscopy;;   RECTAL BIOPSY N/A 04/01/2023   Procedure: PERIANAL LESION BIOPSY;  Surgeon: Lewie Chamber, DO;  Location: [facility];  Service: General;  Laterality: N/A;    TUBAL LIGATION Bilateral 2004   OB History  No obstetric history on file.  Patient denies any other pertinent gynecologic issues.  No current facility-administered medications on file prior to encounter.   Current Outpatient Medications on File Prior to Encounter  Medication Sig Dispense Refill   acetaminophen (TYLENOL) 500 MG tablet Take 500-1,000 mg by mouth every 6 (six) hours as needed for moderate pain or mild pain.     clindamycin (CLEOCIN) 150 MG capsule Take 150 mg by mouth 4 (four) times daily.     docusate sodium (COLACE) 100 MG capsule Take 1 capsule (100 mg total) by mouth 2 (two) times daily. (Patient taking differently: Take 100 mg by mouth 2 (two) times daily as needed for mild constipation.) 60 capsule 2   Multiple Vitamins-Minerals (MULTIVITAMIN GUMMIES WOMENS) CHEW Chew 1 tablet by mouth daily.     oxyCODONE (OXY IR/ROXICODONE) 5 MG immediate release tablet Take 1-2 tablets (5-10 mg total) by mouth every 6 (six) hours as needed for severe pain. 30 tablet 0   traZODone (DESYREL) 100 MG tablet Take 100 mg by mouth at bedtime.     Allergies  Allergen Reactions   Penicillins     Unknown childhood reaction    Social History:   reports that she quit smoking about 5 months ago. Her smoking use included cigarettes. She has never used smokeless tobacco. She reports that she does not currently use drugs after having  used the following drugs: Marijuana. She reports that she does not drink alcohol. Family History  Problem Relation Age of Onset   Cancer Mother    Alcohol abuse Father    Drug abuse Father    Early death Father    Hearing loss Maternal Grandmother    Heart disease Maternal Grandmother    Kidney cancer Maternal Grandmother    Prostate cancer Maternal Grandfather    Colon cancer Neg Hx     Review of Systems: Pertinent items noted in HPI and remainder of comprehensive ROS otherwise negative.  PHYSICAL EXAM: Blood pressure 120/74, pulse 83, temperature (!)  97.5 F (36.4 C), resp. rate (!) 22, height 5\' 7"  (1.702 m), weight 85.7 kg, last menstrual period 06/22/2023, SpO2 99%. CONSTITUTIONAL: Well-developed, well-nourished female in no acute distress.  SKIN: Skin is warm and dry. No rash noted. Not diaphoretic. No erythema. No pallor. NEUROLOGIC: Alert and oriented to person, place, and time. Normal reflexes, muscle tone coordination. No cranial nerve deficit noted. PSYCHIATRIC: Normal mood and affect. Normal behavior. Normal judgment and thought content. CARDIOVASCULAR: Normal heart rate noted, regular rhythm RESPIRATORY: Effort and breath sounds normal, no problems with respiration noted ABDOMEN: Soft, nontender, nondistended. PELVIC: deferred MUSCULOSKELETAL: no calf tenderness bilaterally EXT: no edema bilaterally, normal pulses  Labs: Results for orders placed or performed during the hospital encounter of 07/07/23 (from the past 336 hour(s))  Pregnancy, urine POC   Collection Time: 07/07/23 10:07 AM  Result Value Ref Range   Preg Test, Ur NEGATIVE NEGATIVE    Assessment: High grade cervical dysplasia   Plan: Cervical laser ablation -IV Toradol -NPO -LR @ 125cc/hr -SCDs to OR -Risk/benefits and alternatives reviewed with the patient including but not limited to risk of bleeding, infection and injury such as cervical shortening.  Questions and concerns were addressed and pt desires to proceed  [name], DO Attending Obstetrician & Gynecologist, [facility] for [facility], [facility]

## 2023-07-07 NOTE — Op Note (Signed)
Preoperative Diagnosis:  High Grade Squamous Intraepithelial lesion, adequate colposcopy  Postoperative Diagnosis:  Same as above  Procedure:  Laser ablation of the cervix  Surgeon:  Dr. [name]  Anaesthesia:  Laryngeal Mask Airway  Findings:  Patient had an abnormal pap smear which was evaluated in the office with colposcopy and directed biopsies.  Pathology report returned as high Grade SIL.  The colposcopy was adequate.  As a result, the patient is admitted for laser ablation of the cervix.  Description of Note:  Patient was taken to the OR and placed in the supine position where she underwent laryngeal mask airway anaesthesia.  She was placed in the dorsal lithotomy position.  She was draped for laser.  Graves speculum was placed and 3% acetic acid used and the laser microscope employed to perform colposcopy which confirmed the office findings.  20cc of 0.5% with epinephrine was placed for a cervical block. Laser was used on typical cervical settings and used to vaporized the squamocolumnar junction to  depth of  5-7 mm peripherally and 7-9 mm centrally.  Surgical margin of several mm was employed beyond the acetowhite epithelium.  Hemostasis was achieved with the laser and Monsel's solution.  Patient was awakened from anaesthesia in good stable condition and all counts were correct.   [name], DO Attending Obstetrician & Gynecologist, [facility] for [employer], [facility]

## 2023-07-07 NOTE — Anesthesia Procedure Notes (Addendum)
Procedure Name: LMA Insertion Date/Time: 07/07/2023 11:47 AM  Performed by: Julian Reil, CRNAPre-anesthesia Checklist: Patient identified, Emergency Drugs available, Suction available and Patient being monitored Patient Re-evaluated:Patient Re-evaluated prior to induction Oxygen Delivery Method: Circle system utilized Preoxygenation: Pre-oxygenation with 100% oxygen Induction Type: IV induction Ventilation: Mask ventilation without difficulty LMA: LMA inserted LMA Size: 4.0 Tube type: Oral Number of attempts: 1 Placement Confirmation: positive ETCO2 Tube secured with: Tape Dental Injury: Teeth and Oropharynx as per pre-operative assessment

## 2023-07-08 ENCOUNTER — Encounter: Payer: Self-pay | Admitting: Obstetrics & Gynecology

## 2023-07-08 ENCOUNTER — Encounter (HOSPITAL_COMMUNITY): Payer: Self-pay | Admitting: Obstetrics & Gynecology

## 2023-07-08 NOTE — Anesthesia Postprocedure Evaluation (Signed)
Anesthesia Post Note  Patient: Marie Duncan  Procedure(s) Performed: CERVICAL ABLATION (Vagina )  Patient location during evaluation: Phase II Anesthesia Type: General Level of consciousness: awake Pain management: pain level controlled Vital Signs Assessment: post-procedure vital signs reviewed and stable Respiratory status: spontaneous breathing and respiratory function stable Cardiovascular status: blood pressure returned to baseline and stable Postop Assessment: no headache and no apparent nausea or vomiting Anesthetic complications: no Comments: Late entry   No notable events documented.   Last Vitals:  Vitals:   07/07/23 1301 07/07/23 1309  BP: 123/80 133/82  Pulse: 78 80  Resp: 13 16  Temp:  (!) 36.4 C  SpO2: 100% 100%    Last Pain:  Vitals:   07/08/23 1354  TempSrc:   PainSc: 0-No pain                 Windell Norfolk

## 2023-07-13 NOTE — Progress Notes (Signed)
[facility] [address] [place] [address]    Clinic Day:  07/13/2023  Referring physician: Elfredia Nevins, MD  Patient Care Team: Elfredia Nevins, MD as PCP - General (Internal Medicine) Doreatha Massed, MD as Medical Oncologist (Medical Oncology) Therese Sarah, RN as Oncology Nurse Navigator (Medical Oncology)   ASSESSMENT & PLAN:   Assessment: 1.  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 - She also fell on tailbone in November 2023 and has been having tailbone bone pain on and off. - She was evaluated by Dr.Pappayliou on 03/26/2023.  Bowels changed every other day.  Examination showed 3 cm ulcerative lesion in the perianal region at the 3 o'clock position.  No evidence of external hemorrhoids.  Small internal hemorrhoids on digital rectal exam. - Biopsy (04/01/2023): Moderately differentiated squamous cell carcinoma   2.  Social/family history: - She lives with family at home.  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  Quit smoking on 01/17/2023.  Smoked 1 pack/day started at age 51. - Mother had breast cancer at age 92.  Maternal grandfather had prostate cancer.  Maternal grandmother had kidney cancer.  Maternal great grandfather had leukemia.    Plan: 1.  Moderately differentiated SCC of the perianal region (T2 N0): - She underwent colonoscopy on 04/22/2023 which did not reveal any tumors.  Tubular adenoma from the descending colon was removed. - She also underwent Pap smear which was positive for HPV.  She is scheduled for colposcopy. - Screening mammogram was BI-RADS Category 0 with possible asymmetry in the right breast.  She will have additional imaging. - Her insurance denied PET scan.  We have done CT CAP on 04/28/2023: No evidence of lymphadenopathy or metastatic disease in the chest, abdomen or pelvis.  2.6 x 1.4 cm  hyperenhancing subcapsular lesion in the hepatic segment 4.  Fatty liver was seen. - MRI of the liver on 04/30/2023: Moderate hepatic steatosis.  Focal fatty sparing in the central left hepatic lobe.  No evidence of metastatic disease. - Clinically she has T2 well-differentiated disease.  She does not have any lymph node involvement.  She may proceed with local excision with adequate margins. - I will make a referral to Dr. Maisie Fus. - RTC 1 month after surgery.     No orders of the defined types were placed in this encounter.     Alben Deeds Teague,acting as a Neurosurgeon for Doreatha Massed, MD.,have documented all relevant documentation on the behalf of Doreatha Massed, MD,as directed by  Doreatha Massed, MD while in the presence of Doreatha Massed, MD.  ***   [place] R Teague   8/26/20248:21 PM  CHIEF COMPLAINT:   Diagnosis: anal squamous cell carcinoma    Cancer Staging  No matching staging information was found for the patient.    Prior Therapy: none  Current Therapy: Surgical resection   HISTORY OF PRESENT ILLNESS:   Oncology History  Primary cancer of perianal skin  04/13/2023 Initial Diagnosis   Primary cancer of perianal skin      INTERVAL HISTORY:   Marie Duncan is a 42 y.o. female presenting to clinic today for follow up of anal squamous cell carcinoma. She was last seen by me on 05/06/23.  Since her last visit, she underwent an excision of her anal mass on 06/17/23 with Dr. Maisie Fus. Surgical pathology of the peri-anal mass revealed: invasive and in situ well to  moderately differentiated SCC; adjacent condyloma with high-grade dysplasia; margins free of invasive carcinoma; and high-grade dysplasia focally in the anterior medial margin.   Of note, she also underwent cervical ablation with Dr. Charlotta Newton on 07/07/23.   Today, she states that she is doing well overall. Her appetite level is at ***%. Her energy level is at ***%.  PAST MEDICAL HISTORY:   Past  Medical History: Past Medical History:  Diagnosis Date   Anal cancer (HCC) 03/2023   oncology--- dr Ellin Saba;  moderateraly differeentated SCC perianal region  s/p rectal bx 04-01-2023 by dr Marletta Lor   Anxiety    Complication of anesthesia    sts after rectal mass removal 7/31, she had a rash during surgery that lasted 3 days postop. She was given benadryl in the OR, but they are not sure which med caused the rash.   Intermittent palpitations    06-01-2023  per pt notices more in the evening ,  stated her pcp is aware, in past year since ,   not all the time , ekg done was normal   Irregular heart beats     Surgical History: Past Surgical History:  Procedure Laterality Date   CERVICAL ABLATION N/A 07/07/2023   Procedure: CERVICAL ABLATION;  Surgeon: [name], DO;  Location: [facility];  Service: Gynecology;  Laterality: N/A;   COLONOSCOPY WITH PROPOFOL N/A 04/22/2023   Procedure: COLONOSCOPY WITH PROPOFOL;  Surgeon: Lanelle Bal, DO;  Location: [facility];  Service: Endoscopy;  Laterality: N/A;  1:15 pm, asa 2   COLPOSCOPY VULVA W/ BIOPSY  06/02/2023   MASS EXCISION N/A 06/17/2023   Procedure: EXCISION ANAL MASS;  Surgeon: Romie Levee, MD;  Location: [facility];  Service: General;  Laterality: N/A;   POLYPECTOMY  04/22/2023   Procedure: POLYPECTOMY;  Surgeon: Lanelle Bal, DO;  Location: [facility];  Service: Endoscopy;;   RECTAL BIOPSY N/A 04/01/2023   Procedure: PERIANAL LESION BIOPSY;  Surgeon: Lewie Chamber, DO;  Location: [facility];  Service: General;  Laterality: N/A;   TUBAL LIGATION Bilateral 2004    Social History: Social History   Socioeconomic History   Marital status: Legally Separated    Spouse name: Not on file   Number of children: Not on file   Years of education: Not on file   Highest education level: Not on file  Occupational History   Not on file  Tobacco Use   Smoking status: Former    Current packs/day: 0.00    Types: Cigarettes     Quit date: 01/17/2023    Years since quitting: 0.4   Smokeless tobacco: Never  Vaping Use   Vaping status: Some Days   Substances: Nicotine, Flavoring   Devices: " it varies"  Substance and Sexual Activity   Alcohol use: No   Drug use: Not Currently    Types: Marijuana    Comment: occassional   Sexual activity: Not on file  Other Topics Concern   Not on file  Social History Narrative   Not on file   Social Determinants of Health   Financial Resource Strain: Low Risk  (04/24/2023)   Overall Financial Resource Strain (CARDIA)    Difficulty of Paying Living Expenses: Not very hard  Food Insecurity: No Food Insecurity (04/24/2023)   Hunger Vital Sign    Worried About Running Out of Food in the Last Year: Never true    Ran Out of Food in the Last Year: Never true  Transportation Needs: No Transportation  Needs (04/14/2023)   PRAPARE - Administrator, Civil Service (Medical): No    Lack of Transportation (Non-Medical): No  Physical Activity: Insufficiently Active (04/24/2023)   Exercise Vital Sign    Days of Exercise per Week: 1 day    Minutes of Exercise per Session: 10 min  Stress: Stress Concern Present (04/24/2023)   [employer] of Occupational Health - Occupational Stress Questionnaire    Feeling of Stress : Rather much  Social Connections: Socially Isolated (04/24/2023)   Social Connection and Isolation Panel [NHANES]    Frequency of Communication with Friends and Family: Twice a week    Frequency of Social Gatherings with Friends and Family: Once a week    Attends Religious Services: Never    Database administrator or Organizations: No    Attends Banker Meetings: Never    Marital Status: Separated  Intimate Partner Violence: Not At Risk (04/24/2023)   Humiliation, Afraid, Rape, and Kick questionnaire    Fear of Current or Ex-Partner: No    Emotionally Abused: No    Physically Abused: No    Sexually Abused: No    Family History: Family History   Problem Relation Age of Onset   Cancer Mother    Alcohol abuse Father    Drug abuse Father    Early death Father    Hearing loss Maternal Grandmother    Heart disease Maternal Grandmother    Kidney cancer Maternal Grandmother    Prostate cancer Maternal Grandfather    Colon cancer Neg Hx     Current Medications:  Current Outpatient Medications:    acetaminophen (TYLENOL) 500 MG tablet, Take 500-1,000 mg by mouth every 6 (six) hours as needed for moderate pain or mild pain., Disp: , Rfl:    clindamycin (CLEOCIN) 150 MG capsule, Take 150 mg by mouth 4 (four) times daily., Disp: , Rfl:    docusate sodium (COLACE) 100 MG capsule, Take 1 capsule (100 mg total) by mouth 2 (two) times daily. (Patient taking differently: Take 100 mg by mouth 2 (two) times daily as needed for mild constipation.), Disp: 60 capsule, Rfl: 2   Multiple Vitamins-Minerals (MULTIVITAMIN GUMMIES WOMENS) CHEW, Chew 1 tablet by mouth daily., Disp: , Rfl:    oxyCODONE (OXY IR/ROXICODONE) 5 MG immediate release tablet, Take 1-2 tablets (5-10 mg total) by mouth every 6 (six) hours as needed for severe pain., Disp: 30 tablet, Rfl: 0   traZODone (DESYREL) 100 MG tablet, Take 100 mg by mouth at bedtime., Disp: , Rfl:    Allergies: Allergies  Allergen Reactions   Penicillins     Unknown childhood reaction    REVIEW OF SYSTEMS:   Review of Systems  Constitutional:  Negative for chills, fatigue and fever.  HENT:   Negative for lump/mass, mouth sores, nosebleeds, sore throat and trouble swallowing.   Eyes:  Negative for eye problems.  Respiratory:  Negative for cough and shortness of breath.   Cardiovascular:  Negative for chest pain, leg swelling and palpitations.  Gastrointestinal:  Negative for abdominal pain, constipation, diarrhea, nausea and vomiting.  Genitourinary:  Negative for bladder incontinence, difficulty urinating, dysuria, frequency, hematuria and nocturia.   Musculoskeletal:  Negative for arthralgias,  back pain, flank pain, myalgias and neck pain.  Skin:  Negative for itching and rash.  Neurological:  Negative for dizziness, headaches and numbness.  Hematological:  Does not bruise/bleed easily.  Psychiatric/Behavioral:  Negative for depression, sleep disturbance and suicidal ideas. The patient is not nervous/anxious.  All other systems reviewed and are negative.    VITALS:   Last menstrual period 06/22/2023.  Wt Readings from Last 3 Encounters:  07/07/23 188 lb 15 oz (85.7 kg)  07/06/23 189 lb (85.7 kg)  06/19/23 189 lb (85.7 kg)    There is no height or weight on file to calculate BMI.  Performance status (ECOG): 0 - Asymptomatic  PHYSICAL EXAM:   Physical Exam Vitals and nursing note reviewed. Exam conducted with a chaperone present.  Constitutional:      Appearance: Normal appearance.  Cardiovascular:     Rate and Rhythm: Normal rate and regular rhythm.     Pulses: Normal pulses.     Heart sounds: Normal heart sounds.  Pulmonary:     Effort: Pulmonary effort is normal.     Breath sounds: Normal breath sounds.  Abdominal:     Palpations: Abdomen is soft. There is no hepatomegaly, splenomegaly or mass.     Tenderness: There is no abdominal tenderness.  Musculoskeletal:     Right lower leg: No edema.     Left lower leg: No edema.  Lymphadenopathy:     Cervical: No cervical adenopathy.     Right cervical: No superficial, deep or posterior cervical adenopathy.    Left cervical: No superficial, deep or posterior cervical adenopathy.     Upper Body:     Right upper body: No supraclavicular or axillary adenopathy.     Left upper body: No supraclavicular or axillary adenopathy.  Neurological:     General: No focal deficit present.     Mental Status: She is alert and oriented to person, place, and time.  Psychiatric:        Mood and Affect: Mood normal.        Behavior: Behavior normal.     LABS:      Latest Ref Rng & Units 06/11/2023    8:45 AM 04/14/2023     8:46 AM  CBC  WBC 4.0 - 10.5 K/uL 8.8  8.6   Hemoglobin 12.0 - 15.0 g/dL 88.4  16.6   Hematocrit 36.0 - 46.0 % 40.2  40.9   Platelets 150 - 400 K/uL 303  314       Latest Ref Rng & Units 06/11/2023    8:45 AM 04/14/2023    8:46 AM  CMP  Glucose 70 - 99 mg/dL 85  95   BUN 6 - 20 mg/dL 8  13   Creatinine 0.63 - 1.00 mg/dL 0.16  0.10   Sodium 932 - 145 mmol/L 138  135   Potassium 3.5 - 5.1 mmol/L 3.8  3.9   Chloride 98 - 111 mmol/L 107  102   CO2 22 - 32 mmol/L 21  19   Calcium 8.9 - 10.3 mg/dL 9.4  9.3   Total Protein 6.5 - 8.1 g/dL  7.5   Total Bilirubin 0.3 - 1.2 mg/dL  0.5   Alkaline Phos 38 - 126 U/L  51   AST 15 - 41 U/L  21   ALT 0 - 44 U/L  26      No results found for: "CEA1", "CEA" / No results found for: "CEA1", "CEA" No results found for: "PSA1" No results found for: "TFT732" No results found for: "CAN125"  No results found for: "TOTALPROTELP", "ALBUMINELP", "A1GS", "A2GS", "BETS", "BETA2SER", "GAMS", "MSPIKE", "SPEI" No results found for: "TIBC", "FERRITIN", "IRONPCTSAT" No results found for: "LDH"   STUDIES:   No results found.

## 2023-07-14 ENCOUNTER — Inpatient Hospital Stay: Payer: 59 | Attending: Hematology | Admitting: Hematology

## 2023-07-14 VITALS — BP 126/91 | HR 96 | Temp 98.1°F | Resp 16 | Wt 188.1 lb

## 2023-07-14 DIAGNOSIS — C210 Malignant neoplasm of anus, unspecified: Secondary | ICD-10-CM

## 2023-07-14 DIAGNOSIS — C44520 Squamous cell carcinoma of anal skin: Secondary | ICD-10-CM | POA: Diagnosis present

## 2023-07-14 NOTE — Patient Instructions (Signed)
[facility] at [facility] Discharge Instructions   You were seen and examined today by Dr. Ellin Saba.  He reviewed the results of your biopsy. The cancer has been completely removed. There was a pre-cancer spot in the margins of the biopsy. This will need to be watched.   We will see you back in 6 months. We will obtain a CT scan prior to your next visit.   Return as scheduled.    Thank you for choosing [facility] at [facility] to provide your oncology and hematology care.  To afford each patient quality time with our provider, please arrive at least 15 minutes before your scheduled appointment time.   If you have a lab appointment with the Cancer Center please come in thru the Main Entrance and check in at the main information desk.  You need to re-schedule your appointment should you arrive 10 or more minutes late.  We strive to give you quality time with our providers, and arriving late affects you and other patients whose appointments are after yours.  Also, if you no show three or more times for appointments you may be dismissed from the clinic at the providers discretion.     Again, thank you for choosing [facility].  Our hope is that these requests will decrease the amount of time that you wait before being seen by our physicians.       _____________________________________________________________  Should you have questions after your visit to [facility], please contact our office at [phone] and follow the prompts.  Our office hours are 8:00 a.m. and 4:30 p.m. Monday - Friday.  Please note that voicemails left after 4:00 p.m. may not be returned until the following business day.  We are closed weekends and major holidays.  You do have access to a nurse 24-7, just call the main number to the clinic [phone] and do not press any options, hold on the line and a nurse will answer the phone.    For  prescription refill requests, have your pharmacy contact our office and allow 72 hours.    Due to Covid, you will need to wear a mask upon entering the hospital. If you do not have a mask, a mask will be given to you at the Main Entrance upon arrival. For doctor visits, patients may have 1 support person age 19 or older with them. For treatment visits, patients can not have anyone with them due to social distancing guidelines and our immunocompromised population.

## 2023-07-15 ENCOUNTER — Encounter: Payer: Self-pay | Admitting: Obstetrics & Gynecology

## 2023-07-15 ENCOUNTER — Ambulatory Visit (INDEPENDENT_AMBULATORY_CARE_PROVIDER_SITE_OTHER): Payer: 59 | Admitting: Obstetrics & Gynecology

## 2023-07-15 VITALS — BP 135/86 | HR 81 | Ht 67.0 in | Wt 188.4 lb

## 2023-07-15 DIAGNOSIS — Z4889 Encounter for other specified surgical aftercare: Secondary | ICD-10-CM

## 2023-07-15 NOTE — Progress Notes (Signed)
    PostOp Visit Note  Marie Duncan is a 42 y.o. No obstetric history on file. female who presents for a postoperative visit. 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  Denies irregular bleeding.  Denies pelvic or abdominal pain Denies fever or chills.  Tolerating gen diet.  +Flatus, Regular BMs.   Overall doing well and reports no acute complaints   Review of Systems Pertinent items are noted in HPI.    Objective:  BP 135/86 (BP Location: Right Arm, Patient Position: Sitting, Cuff Size: Normal)   Pulse 81   Ht 5\' 7"  (1.702 m)   Wt 188 lb 6.4 oz (85.5 kg)   LMP 06/22/2023 (Within Days) Comment: 07/07/23 urine preg.negative  BMI 29.51 kg/m    Physical Examination:  GENERAL ASSESSMENT: well developed and well nourished SKIN: normal color, no lesions CHEST: normal air exchange, respiratory effort normal with no retractions HEART: regular rate and rhythm ABDOMEN: soft, non-distended GU: Normal external genitalia, vagina pink moist mucosa no abnormal discharge or lesions.  Cervix visualized healing appropriately EXTREMITY: no edema PSYCH: mood appropriate, normal affect       Assessment:    S/p cervical ablation   Plan:   -Continue pelvic rest for another 3 to 4 weeks -Healing appropriately Plan for repeat testing in 6 months  [name], DO Attending Obstetrician & Gynecologist, [facility] for [employer], [facility]

## 2023-11-03 ENCOUNTER — Encounter: Payer: Self-pay | Admitting: Obstetrics & Gynecology

## 2023-11-04 ENCOUNTER — Other Ambulatory Visit: Payer: Self-pay | Admitting: Obstetrics & Gynecology

## 2023-11-04 DIAGNOSIS — N760 Acute vaginitis: Secondary | ICD-10-CM

## 2023-11-04 MED ORDER — FLUCONAZOLE 150 MG PO TABS
ORAL_TABLET | ORAL | 2 refills | Status: AC
Start: 2023-11-04 — End: ?

## 2023-11-04 NOTE — Progress Notes (Signed)
Rx for Dilfucan  [name], DO Attending Obstetrician & Gynecologist, [facility] for [facility], [facility]

## 2023-11-25 ENCOUNTER — Other Ambulatory Visit (HOSPITAL_COMMUNITY): Payer: Self-pay | Admitting: Internal Medicine

## 2023-11-25 DIAGNOSIS — N6489 Other specified disorders of breast: Secondary | ICD-10-CM

## 2023-11-27 ENCOUNTER — Other Ambulatory Visit: Payer: 59

## 2023-12-04 ENCOUNTER — Encounter (HOSPITAL_COMMUNITY): Payer: 59

## 2023-12-04 DIAGNOSIS — Z1231 Encounter for screening mammogram for malignant neoplasm of breast: Secondary | ICD-10-CM

## 2023-12-22 ENCOUNTER — Ambulatory Visit (HOSPITAL_COMMUNITY)
Admission: RE | Admit: 2023-12-22 | Discharge: 2023-12-22 | Disposition: A | Discharge status: Home / Self Care | Payer: 59 | Source: Ambulatory Visit | Attending: Internal Medicine | Admitting: Internal Medicine

## 2023-12-22 ENCOUNTER — Encounter (HOSPITAL_COMMUNITY): Payer: Self-pay

## 2023-12-22 DIAGNOSIS — N6489 Other specified disorders of breast: Secondary | ICD-10-CM | POA: Diagnosis present

## 2024-01-14 ENCOUNTER — Ambulatory Visit (HOSPITAL_COMMUNITY)
Admission: RE | Admit: 2024-01-14 | Discharge: 2024-01-14 | Disposition: A | Discharge status: Home / Self Care | Payer: 59 | Source: Ambulatory Visit | Attending: Hematology | Admitting: Hematology

## 2024-01-14 ENCOUNTER — Inpatient Hospital Stay: Payer: 59 | Attending: Hematology

## 2024-01-14 DIAGNOSIS — Z79899 Other long term (current) drug therapy: Secondary | ICD-10-CM | POA: Insufficient documentation

## 2024-01-14 DIAGNOSIS — C210 Malignant neoplasm of anus, unspecified: Secondary | ICD-10-CM | POA: Insufficient documentation

## 2024-01-14 DIAGNOSIS — K760 Fatty (change of) liver, not elsewhere classified: Secondary | ICD-10-CM | POA: Diagnosis not present

## 2024-01-14 LAB — CBC WITH DIFFERENTIAL/PLATELET
Abs Immature Granulocytes: 0.03 10*3/uL (ref 0.00–0.07)
Basophils Absolute: 0.1 10*3/uL (ref 0.0–0.1)
Basophils Relative: 1 %
Eosinophils Absolute: 0.1 10*3/uL (ref 0.0–0.5)
Eosinophils Relative: 2 %
HCT: 41.2 % (ref 36.0–46.0)
Hemoglobin: 14.1 g/dL (ref 12.0–15.0)
Immature Granulocytes: 0 %
Lymphocytes Relative: 40 %
Lymphs Abs: 2.9 10*3/uL (ref 0.7–4.0)
MCH: 33.7 pg (ref 26.0–34.0)
MCHC: 34.2 g/dL (ref 30.0–36.0)
MCV: 98.6 fL (ref 80.0–100.0)
Monocytes Absolute: 0.6 10*3/uL (ref 0.1–1.0)
Monocytes Relative: 8 %
Neutro Abs: 3.5 10*3/uL (ref 1.7–7.7)
Neutrophils Relative %: 49 %
Platelets: 290 10*3/uL (ref 150–400)
RBC: 4.18 MIL/uL (ref 3.87–5.11)
RDW: 11.4 % — ABNORMAL LOW (ref 11.5–15.5)
WBC: 7.2 10*3/uL (ref 4.0–10.5)
nRBC: 0 % (ref 0.0–0.2)

## 2024-01-14 LAB — COMPREHENSIVE METABOLIC PANEL
ALT: 22 U/L (ref 0–44)
AST: 23 U/L (ref 15–41)
Albumin: 4.5 g/dL (ref 3.5–5.0)
Alkaline Phosphatase: 53 U/L (ref 38–126)
Anion gap: 10 (ref 5–15)
BUN: 9 mg/dL (ref 6–20)
CO2: 23 mmol/L (ref 22–32)
Calcium: 9.5 mg/dL (ref 8.9–10.3)
Chloride: 104 mmol/L (ref 98–111)
Creatinine, Ser: 0.74 mg/dL (ref 0.44–1.00)
GFR, Estimated: >60 mL/min (ref >60)
Glucose, Bld: 94 mg/dL (ref 70–99)
Potassium: 3.9 mmol/L (ref 3.5–5.1)
Sodium: 137 mmol/L (ref 135–145)
Total Bilirubin: 0.8 mg/dL (ref 0.0–1.2)
Total Protein: 7.4 g/dL (ref 6.5–8.1)

## 2024-01-14 MED ORDER — IOHEXOL 300 MG/ML  SOLN
100.0000 mL | Freq: Once | INTRAMUSCULAR | Status: AC | PRN
  Administered 2024-01-14: 100 mL via INTRAVENOUS

## 2024-01-21 ENCOUNTER — Inpatient Hospital Stay: Payer: 59 | Attending: Hematology | Admitting: Hematology

## 2024-01-21 VITALS — BP 130/68 | HR 64 | Temp 99.3°F | Resp 18 | Ht 67.0 in | Wt 187.0 lb

## 2024-01-21 DIAGNOSIS — C210 Malignant neoplasm of anus, unspecified: Secondary | ICD-10-CM | POA: Diagnosis present

## 2024-01-21 DIAGNOSIS — Z87891 Personal history of nicotine dependence: Secondary | ICD-10-CM | POA: Diagnosis not present

## 2024-01-21 NOTE — Progress Notes (Signed)
 [facility] [address] [place] [address]    Clinic Day:  01/21/24   Referring physician: Elfredia Nevins, MD  Patient Care Team: Elfredia Nevins, MD as PCP - General (Internal Medicine) Doreatha Massed, MD as Medical Oncologist (Medical Oncology) Therese Sarah, RN as Oncology Nurse Navigator (Medical Oncology)   ASSESSMENT & PLAN:   Assessment: 1.  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 - She also fell on tailbone in November 2023 and has been having tailbone bone pain on and off. - She was evaluated by Dr.Pappayliou on 03/26/2023.  Bowels changed every other day.  Examination showed 3 cm ulcerative lesion in the perianal region at the 3 o'clock position.  No evidence of external hemorrhoids.  Small internal hemorrhoids on digital rectal exam. - Biopsy (04/01/2023): Moderately differentiated squamous cell carcinoma - CT CAP (04/28/2023): No evidence of lymphadenopathy or metastatic disease in the chest, abdomen or pelvis.  2.6 x 2 1.4 cm hyperenhancing subcapsular lesion in the hepatic segment 4.  Fatty liver was seen. - MRI liver (04/29/2021): Moderate hepatic steatosis.  Focal fatty sparing in the central left hepatic lobe with no evidence of metastatic disease. - Excision of perianal mass by Dr. Maisie Fus on 06/17/2023 - Pathology: Invasive and in situ well to moderately differentiated squamous cell carcinoma.  Adjacent condyloma with high-grade dysplasia.  Margins free of invasive carcinoma.  High-grade dysplasia focally present in the anterior medial margin.   2.  Social/family history: - She lives with family at home.  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  Quit smoking on 01/17/2023.  Smoked 1 pack/day started at age 20. - Mother had breast cancer at age 58.  Maternal grandfather had prostate cancer.  Maternal grandmother had  kidney cancer.  Maternal great grandfather had leukemia.    Plan: 1.  Moderately differentiated SCC of the perianal region (T2 N0): - She reports mild pain in the left lower quadrant on and off. - She was last evaluated by Dr. Maisie Fus in December 2024, no evidence of local recurrence at that time. - Labs today: Normal LFTs and CBC. - CTAP on 01/14/2024: Similar soft tissue thickening near the anal verge.  No evidence of metastatic disease.  Early filling of dilated gonadal veins and pelvic collateral vessels which can be seen in the setting of pelvic congestion syndrome.  CT chest did not show any metastatic disease. - She will continue follow-up with Dr. Maisie Fus every 3 months.  RTC 6 months with repeat CT CAP.     Orders Placed This Encounter  Procedures   CT CHEST ABDOMEN PELVIS W CONTRAST    Standing Status:   Future    Expected Date:   07/23/2024    Expiration Date:   01/20/2025    If indicated for the ordered procedure, I authorize the administration of contrast media per Radiology protocol:   Yes    Does the patient have a contrast media/X-ray dye allergy?:   No    Preferred imaging location?:   [facility]    If indicated for the ordered procedure, I authorize the administration of oral contrast media per Radiology protocol:   Yes   CBC with Differential    Standing Status:   Future    Expected Date:   07/18/2024    Expiration Date:   01/20/2025   Comprehensive metabolic panel    Standing Status:  Future    Expected Date:   07/18/2024    Expiration Date:   01/20/2025      Alben Deeds Teague,acting as a scribe for Doreatha Massed, MD.,have documented all relevant documentation on the behalf of Doreatha Massed, MD,as directed by  Doreatha Massed, MD while in the presence of Doreatha Massed, MD.  I, Doreatha Massed MD, have reviewed the above documentation for accuracy and completeness, and I agree with the above.     Doreatha Massed, MD   3/6/20253:26  PM  CHIEF COMPLAINT:   Diagnosis: anal squamous cell carcinoma    Cancer Staging  No matching staging information was found for the patient.    Prior Therapy: none  Current Therapy: Surgical resection   HISTORY OF PRESENT ILLNESS:   Oncology History  Primary cancer of perianal skin  04/13/2023 Initial Diagnosis   Primary cancer of perianal skin      INTERVAL HISTORY:   Marie Duncan is a 43 y.o. female presenting to clinic today for follow up of anal squamous cell carcinoma. She was last seen by me on 07/14/23.  Since her last visit, she underwent CT A/P on 01/14/24 that found: Similar soft tissue thickening near the anal verge, may reflect patient's known anal cancer or treatment change. No convincing evidence of metastatic disease in the abdomen or pelvis. Similar appearance of the hypodensity along the falciform ligament of the liver commonly reflecting focal fatty infiltration or differential perfusion. Early filling of dilated gonadal veins and pelvic collateral vessels, which can be seen in the setting of pelvic congestion syndrome.  CT chest done on 01/14/24 found: No evidence of metastatic disease in the chest.   Today, she states that she is doing well overall. Her appetite level is at 50%. Her energy level is at 75%.  Iya notes pelvic pain, worsened on the left side. She has follow-up with Dr. Maisie Fus, her most recent being on 11/03/23 and her next visit supposedly on 02/01/23.   PAST MEDICAL HISTORY:   Past Medical History: Past Medical History:  Diagnosis Date   Anal cancer (HCC) 03/2023   oncology--- dr Ellin Saba;  moderateraly differeentated SCC perianal region  s/p rectal bx 04-01-2023 by dr Marletta Lor   Anxiety    Complication of anesthesia    sts after rectal mass removal 7/31, she had a rash during surgery that lasted 3 days postop. She was given benadryl in the OR, but they are not sure which med caused the rash.   Intermittent palpitations    06-01-2023  per pt  notices more in the evening ,  stated her pcp is aware, in past year since ,   not all the time , ekg done was normal   Irregular heart beats     Surgical History: Past Surgical History:  Procedure Laterality Date   CERVICAL ABLATION N/A 07/07/2023   Procedure: CERVICAL ABLATION;  Surgeon: [name], DO;  Location: [facility];  Service: Gynecology;  Laterality: N/A;   COLONOSCOPY WITH PROPOFOL N/A 04/22/2023   Procedure: COLONOSCOPY WITH PROPOFOL;  Surgeon: Lanelle Bal, DO;  Location: [facility];  Service: Endoscopy;  Laterality: N/A;  1:15 pm, asa 2   COLPOSCOPY VULVA W/ BIOPSY  06/02/2023   MASS EXCISION N/A 06/17/2023   Procedure: EXCISION ANAL MASS;  Surgeon: Romie Levee, MD;  Location: [facility];  Service: General;  Laterality: N/A;   POLYPECTOMY  04/22/2023   Procedure: POLYPECTOMY;  Surgeon: Lanelle Bal, DO;  Location: [facility];  Service: Endoscopy;;  RECTAL BIOPSY N/A 04/01/2023   Procedure: PERIANAL LESION BIOPSY;  Surgeon: Lewie Chamber, DO;  Location: [facility];  Service: General;  Laterality: N/A;   TUBAL LIGATION Bilateral 2004    Social History: Social History   Socioeconomic History   Marital status: Legally Separated    Spouse name: Not on file   Number of children: Not on file   Years of education: Not on file   Highest education level: Not on file  Occupational History   Not on file  Tobacco Use   Smoking status: Former    Current packs/day: 0.00    Types: Cigarettes    Quit date: 01/17/2023    Years since quitting: 1.0   Smokeless tobacco: Never  Vaping Use   Vaping status: Some Days   Substances: Nicotine, Flavoring   Devices: " it varies"  Substance and Sexual Activity   Alcohol use: No   Drug use: Not Currently    Types: Marijuana    Comment: occassional   Sexual activity: Not on file  Other Topics Concern   Not on file  Social History Narrative   Not on file   Social Drivers of Health   Financial Resource Strain:  Low Risk  (04/24/2023)   Overall Financial Resource Strain (CARDIA)    Difficulty of Paying Living Expenses: Not very hard  Food Insecurity: No Food Insecurity (04/24/2023)   Hunger Vital Sign    Worried About Running Out of Food in the Last Year: Never true    Ran Out of Food in the Last Year: Never true  Transportation Needs: No Transportation Needs (04/14/2023)   PRAPARE - Administrator, Civil Service (Medical): No    Lack of Transportation (Non-Medical): No  Physical Activity: Insufficiently Active (04/24/2023)   Exercise Vital Sign    Days of Exercise per Week: 1 day    Minutes of Exercise per Session: 10 min  Stress: Stress Concern Present (04/24/2023)   [employer] of Occupational Health - Occupational Stress Questionnaire    Feeling of Stress : Rather much  Social Connections: Socially Isolated (04/24/2023)   Social Connection and Isolation Panel [NHANES]    Frequency of Communication with Friends and Family: Twice a week    Frequency of Social Gatherings with Friends and Family: Once a week    Attends Religious Services: Never    Database administrator or Organizations: No    Attends Banker Meetings: Never    Marital Status: Separated  Intimate Partner Violence: Not At Risk (04/24/2023)   Humiliation, Afraid, Rape, and Kick questionnaire    Fear of Current or Ex-Partner: No    Emotionally Abused: No    Physically Abused: No    Sexually Abused: No    Family History: Family History  Problem Relation Age of Onset   Cancer Mother    Alcohol abuse Father    Drug abuse Father    Early death Father    Hearing loss Maternal Grandmother    Heart disease Maternal Grandmother    Kidney cancer Maternal Grandmother    Prostate cancer Maternal Grandfather    Colon cancer Neg Hx     Current Medications:  Current Outpatient Medications:    acetaminophen (TYLENOL) 500 MG tablet, Take 500-1,000 mg by mouth every 6 (six) hours as needed for moderate pain or  mild pain., Disp: , Rfl:    Fluconazole (DIFLUCAN PO), Take by mouth., Disp: , Rfl:    fluconazole (DIFLUCAN) 150 MG  tablet, Take one tablet once then repeat in 3 days if needed, Disp: 2 tablet, Rfl: 2   Multiple Vitamins-Minerals (MULTIVITAMIN GUMMIES WOMENS) CHEW, Chew 1 tablet by mouth daily., Disp: , Rfl:    traZODone (DESYREL) 100 MG tablet, Take 100 mg by mouth at bedtime., Disp: , Rfl:    Allergies: Allergies  Allergen Reactions   Penicillins     Unknown childhood reaction    REVIEW OF SYSTEMS:   Review of Systems  Constitutional:  Negative for chills, fatigue and fever.  HENT:   Negative for lump/mass, mouth sores, nosebleeds, sore throat and trouble swallowing.   Eyes:  Negative for eye problems.  Respiratory:  Positive for shortness of breath (occasional). Negative for cough.   Cardiovascular:  Positive for palpitations. Negative for chest pain and leg swelling.  Gastrointestinal:  Negative for abdominal pain, constipation, diarrhea, nausea and vomiting.  Genitourinary:  Positive for pelvic pain (5/10 severity). Negative for bladder incontinence, difficulty urinating, dysuria, frequency, hematuria and nocturia.   Musculoskeletal:  Negative for arthralgias, back pain, flank pain, myalgias and neck pain.  Skin:  Negative for itching and rash.  Neurological:  Positive for headaches. Negative for dizziness and numbness.  Hematological:  Does not bruise/bleed easily.  Psychiatric/Behavioral:  Positive for depression and sleep disturbance. Negative for suicidal ideas. The patient is nervous/anxious.   All other systems reviewed and are negative.    VITALS:   Blood pressure 130/68, pulse 64, temperature 99.3 F (37.4 C), temperature source Tympanic, resp. rate 18, height 5\' 7"  (1.702 m), weight 187 lb (84.8 kg), SpO2 100%.  Wt Readings from Last 3 Encounters:  01/21/24 187 lb (84.8 kg)  07/15/23 188 lb 6.4 oz (85.5 kg)  07/14/23 188 lb 1.6 oz (85.3 kg)    Body mass index is  29.29 kg/m.  Performance status (ECOG): 0 - Asymptomatic  PHYSICAL EXAM:   Physical Exam Vitals and nursing note reviewed. Exam conducted with a chaperone present.  Constitutional:      Appearance: Normal appearance.  Cardiovascular:     Rate and Rhythm: Normal rate and regular rhythm.     Pulses: Normal pulses.     Heart sounds: Normal heart sounds.  Pulmonary:     Effort: Pulmonary effort is normal.     Breath sounds: Normal breath sounds.  Abdominal:     Palpations: Abdomen is soft. There is no hepatomegaly, splenomegaly or mass.     Tenderness: There is no abdominal tenderness.     Comments: +LLQ pain  Musculoskeletal:     Right lower leg: No edema.     Left lower leg: No edema.  Lymphadenopathy:     Cervical: No cervical adenopathy.     Right cervical: No superficial, deep or posterior cervical adenopathy.    Left cervical: No superficial, deep or posterior cervical adenopathy.     Upper Body:     Right upper body: No supraclavicular or axillary adenopathy.     Left upper body: No supraclavicular or axillary adenopathy.     Lower Body: No right inguinal adenopathy. No left inguinal adenopathy.  Neurological:     General: No focal deficit present.     Mental Status: She is alert and oriented to person, place, and time.  Psychiatric:        Mood and Affect: Mood normal.        Behavior: Behavior normal.     LABS:      Latest Ref Rng & Units 01/14/2024   10:05 AM  06/11/2023    8:45 AM 04/14/2023    8:46 AM  CBC  WBC 4.0 - 10.5 K/uL 7.2  8.8  8.6   Hemoglobin 12.0 - 15.0 g/dL 16.1  09.6  04.5   Hematocrit 36.0 - 46.0 % 41.2  40.2  40.9   Platelets 150 - 400 K/uL 290  303  314       Latest Ref Rng & Units 01/14/2024   10:05 AM 06/11/2023    8:45 AM 04/14/2023    8:46 AM  CMP  Glucose 70 - 99 mg/dL 94  85  95   BUN 6 - 20 mg/dL 9  8  13    Creatinine 0.44 - 1.00 mg/dL 4.09  8.11  9.14   Sodium 135 - 145 mmol/L 137  138  135   Potassium 3.5 - 5.1 mmol/L 3.9   3.8  3.9   Chloride 98 - 111 mmol/L 104  107  102   CO2 22 - 32 mmol/L 23  21  19    Calcium 8.9 - 10.3 mg/dL 9.5  9.4  9.3   Total Protein 6.5 - 8.1 g/dL 7.4   7.5   Total Bilirubin 0.0 - 1.2 mg/dL 0.8   0.5   Alkaline Phos 38 - 126 U/L 53   51   AST 15 - 41 U/L 23   21   ALT 0 - 44 U/L 22   26      No results found for: "CEA1", "CEA" / No results found for: "CEA1", "CEA" No results found for: "PSA1" No results found for: "NWG956" No results found for: "CAN125"  No results found for: "TOTALPROTELP", "ALBUMINELP", "A1GS", "A2GS", "BETS", "BETA2SER", "GAMS", "MSPIKE", "SPEI" No results found for: "TIBC", "FERRITIN", "IRONPCTSAT" No results found for: "LDH"   STUDIES:   CT Chest Wo Contrast Result Date: 01/16/2024 CLINICAL DATA:  History of anal cancer, follow-up. * Tracking Code: BO * EXAM: CT CHEST WITHOUT CONTRAST TECHNIQUE: Multidetector CT imaging of the chest was performed following the standard protocol without IV contrast. RADIATION DOSE REDUCTION: This exam was performed according to the departmental dose-optimization program which includes automated exposure control, adjustment of the mA and/or kV according to patient size and/or use of iterative reconstruction technique. COMPARISON:  CT April 28, 2023 FINDINGS: Cardiovascular: No significant vascular findings. Normal heart size. No pericardial effusion. Mediastinum/Nodes: No suspicious thyroid nodule. No pathologically enlarged mediastinal, hilar or axillary lymph nodes noting limited sensitivity of the hilar structures on noncontrast enhanced examination. The esophagus is grossly unremarkable. Lungs/Pleura: No suspicious pulmonary nodules or masses. Bibasilar atelectasis/scarring. No pleural effusion. No pneumothorax. Upper Abdomen: Please refer to same day CT of the abdomen and pelvis for findings below the diaphragm. Musculoskeletal: No aggressive lytic or blastic lesion of bone. IMPRESSION: 1. No evidence of metastatic disease in  the chest. 2. Please refer to same day CT of the abdomen and pelvis for findings below the diaphragm. Electronically Signed   By: Maudry Mayhew M.D.   On: 01/16/2024 16:38   CT ABDOMEN PELVIS W CONTRAST Result Date: 01/16/2024 CLINICAL DATA:  History of anal cancer, monitor. * Tracking Code: BO * EXAM: CT ABDOMEN AND PELVIS WITH CONTRAST TECHNIQUE: Multidetector CT imaging of the abdomen and pelvis was performed using the standard protocol following bolus administration of intravenous contrast. RADIATION DOSE REDUCTION: This exam was performed according to the departmental dose-optimization program which includes automated exposure control, adjustment of the mA and/or kV according to patient size and/or use of iterative reconstruction technique.  CONTRAST:  OMNIPAQUE IOHEXOL 300 MG/ML  SOLN COMPARISON:  CT April 28, 2023 FINDINGS: Lower chest: Please refer to same day CT of the chest for findings above the diaphragm. Hepatobiliary: Hypoenhancing area along the gallbladder fossa commonly reflects focal fatty infiltration or differential perfusion and is similar prior. No new suspicious hepatic lesion. Gallbladder is unremarkable. No biliary ductal dilation. Pancreas: No pancreatic ductal dilation or evidence of acute inflammation. Spleen: No splenomegaly. Adrenals/Urinary Tract: Bilateral adrenal glands appear normal. No hydronephrosis. Subcentimeter hypodense renal lesions are technically too small to accurately characterize but statistically likely to reflect cysts and considered benign requiring no independent imaging follow-up. Urinary bladder is unremarkable for degree of distension. Stomach/Bowel: Stomach is unremarkable for degree of distension. No pathologic dilation of small or large bowel. Normal appendix. No evidence of acute bowel inflammation. Similar soft tissue thickening near the anal verge on image 83/series 2. Vascular/Lymphatic: Normal caliber abdominal aorta. Retroaortic left renal vein.  Smooth IVC contours. The portal, splenic and superior mesenteric veins are patent. Early filling of dilated gonadal veins and pelvic collateral vessels. No pathologically enlarged abdominal or pelvic lymph nodes. Reproductive: Uterus and bilateral adnexa are unremarkable. Other: No significant abdominopelvic free fluid. Musculoskeletal: No aggressive lytic or blastic lesion of bone. IMPRESSION: 1. Similar soft tissue thickening near the anal verge, may reflect patient's known anal cancer or treatment change. This would be better assessed by direct visualization. 2. No convincing evidence of metastatic disease in the abdomen or pelvis. 3. Similar appearance of the hypodensity along the falciform ligament of the liver commonly reflecting focal fatty infiltration or differential perfusion. Suggest continued attention on follow-up imaging. 4. Early filling of dilated gonadal veins and pelvic collateral vessels, which can be seen in the setting of pelvic congestion syndrome. Electronically Signed   By: Maudry Mayhew M.D.   On: 01/16/2024 16:23

## 2024-01-21 NOTE — Patient Instructions (Signed)
 [facility] at [facility] Discharge Instructions   You were seen and examined today by Dr. Ellin Saba.  He reviewed the results of your lab work which are normal/stable.   He reviewed the results of your CT scans which did not show any evidence of cancer.   We will see you back in 6 months. We will repeat lab work and a CT scan prior to this visit.    Return as scheduled.    Thank you for choosing [facility] at [facility] to provide your oncology and hematology care.  To afford each patient quality time with our provider, please arrive at least 15 minutes before your scheduled appointment time.   If you have a lab appointment with the Cancer Center please come in thru the Main Entrance and check in at the main information desk.  You need to re-schedule your appointment should you arrive 10 or more minutes late.  We strive to give you quality time with our providers, and arriving late affects you and other patients whose appointments are after yours.  Also, if you no show three or more times for appointments you may be dismissed from the clinic at the providers discretion.     Again, thank you for choosing [facility].  Our hope is that these requests will decrease the amount of time that you wait before being seen by our physicians.       _____________________________________________________________  Should you have questions after your visit to [facility], please contact our office at [phone] and follow the prompts.  Our office hours are 8:00 a.m. and 4:30 p.m. Monday - Friday.  Please note that voicemails left after 4:00 p.m. may not be returned until the following business day.  We are closed weekends and major holidays.  You do have access to a nurse 24-7, just call the main number to the clinic [phone] and do not press any options, hold on the line and a nurse will answer the phone.    For prescription  refill requests, have your pharmacy contact our office and allow 72 hours.    Due to Covid, you will need to wear a mask upon entering the hospital. If you do not have a mask, a mask will be given to you at the Main Entrance upon arrival. For doctor visits, patients may have 1 support person age 7 or older with them. For treatment visits, patients can not have anyone with them due to social distancing guidelines and our immunocompromised population.

## 2024-05-23 ENCOUNTER — Other Ambulatory Visit (HOSPITAL_COMMUNITY): Payer: Self-pay | Admitting: Internal Medicine

## 2024-05-23 DIAGNOSIS — N6489 Other specified disorders of breast: Secondary | ICD-10-CM

## 2024-06-07 ENCOUNTER — Other Ambulatory Visit (HOSPITAL_COMMUNITY): Payer: Self-pay | Admitting: Internal Medicine

## 2024-06-07 DIAGNOSIS — E049 Nontoxic goiter, unspecified: Secondary | ICD-10-CM

## 2024-06-16 ENCOUNTER — Encounter (HOSPITAL_COMMUNITY): Payer: Self-pay

## 2024-06-16 ENCOUNTER — Ambulatory Visit (HOSPITAL_COMMUNITY)
Admission: RE | Admit: 2024-06-16 | Discharge: 2024-06-16 | Disposition: A | Discharge status: Home / Self Care | Source: Ambulatory Visit | Attending: Internal Medicine | Admitting: Internal Medicine

## 2024-06-16 DIAGNOSIS — N6489 Other specified disorders of breast: Secondary | ICD-10-CM

## 2024-06-17 ENCOUNTER — Ambulatory Visit (HOSPITAL_COMMUNITY)
Admission: RE | Admit: 2024-06-17 | Discharge: 2024-06-17 | Disposition: A | Discharge status: Home / Self Care | Source: Ambulatory Visit | Attending: Internal Medicine | Admitting: Internal Medicine

## 2024-06-17 DIAGNOSIS — E049 Nontoxic goiter, unspecified: Secondary | ICD-10-CM | POA: Insufficient documentation

## 2024-07-22 ENCOUNTER — Ambulatory Visit (HOSPITAL_COMMUNITY)
Admission: RE | Admit: 2024-07-22 | Discharge: 2024-07-22 | Disposition: A | Discharge status: Home / Self Care | Source: Ambulatory Visit | Attending: Hematology | Admitting: Hematology

## 2024-07-22 ENCOUNTER — Inpatient Hospital Stay: Attending: Oncology

## 2024-07-22 DIAGNOSIS — C210 Malignant neoplasm of anus, unspecified: Secondary | ICD-10-CM

## 2024-07-22 DIAGNOSIS — F17210 Nicotine dependence, cigarettes, uncomplicated: Secondary | ICD-10-CM | POA: Diagnosis not present

## 2024-07-22 DIAGNOSIS — R102 Pelvic and perineal pain: Secondary | ICD-10-CM | POA: Diagnosis not present

## 2024-07-22 DIAGNOSIS — G8929 Other chronic pain: Secondary | ICD-10-CM | POA: Diagnosis not present

## 2024-07-22 DIAGNOSIS — R630 Anorexia: Secondary | ICD-10-CM | POA: Insufficient documentation

## 2024-07-22 DIAGNOSIS — Z880 Allergy status to penicillin: Secondary | ICD-10-CM | POA: Diagnosis not present

## 2024-07-22 DIAGNOSIS — Z79899 Other long term (current) drug therapy: Secondary | ICD-10-CM | POA: Diagnosis not present

## 2024-07-22 DIAGNOSIS — C211 Malignant neoplasm of anal canal: Secondary | ICD-10-CM | POA: Diagnosis present

## 2024-07-22 DIAGNOSIS — F17290 Nicotine dependence, other tobacco product, uncomplicated: Secondary | ICD-10-CM | POA: Insufficient documentation

## 2024-07-22 LAB — COMPREHENSIVE METABOLIC PANEL WITH GFR
ALT: 21 U/L (ref 0–44)
AST: 24 U/L (ref 15–41)
Albumin: 3.9 g/dL (ref 3.5–5.0)
Alkaline Phosphatase: 52 U/L (ref 38–126)
Anion gap: 8 (ref 5–15)
BUN: 8 mg/dL (ref 6–20)
CO2: 23 mmol/L (ref 22–32)
Calcium: 9 mg/dL (ref 8.9–10.3)
Chloride: 107 mmol/L (ref 98–111)
Creatinine, Ser: 0.69 mg/dL (ref 0.44–1.00)
GFR, Estimated: >60 mL/min (ref >60)
Glucose, Bld: 95 mg/dL (ref 70–99)
Potassium: 3.5 mmol/L (ref 3.5–5.1)
Sodium: 138 mmol/L (ref 135–145)
Total Bilirubin: 0.7 mg/dL (ref 0.0–1.2)
Total Protein: 6.8 g/dL (ref 6.5–8.1)

## 2024-07-22 LAB — CBC WITH DIFFERENTIAL/PLATELET
Abs Immature Granulocytes: 0.02 K/uL (ref 0.00–0.07)
Basophils Absolute: 0.1 K/uL (ref 0.0–0.1)
Basophils Relative: 1 %
Eosinophils Absolute: 0.1 K/uL (ref 0.0–0.5)
Eosinophils Relative: 2 %
HCT: 38.2 % (ref 36.0–46.0)
Hemoglobin: 13.1 g/dL (ref 12.0–15.0)
Immature Granulocytes: 0 %
Lymphocytes Relative: 37 %
Lymphs Abs: 2.3 K/uL (ref 0.7–4.0)
MCH: 34.7 pg — ABNORMAL HIGH (ref 26.0–34.0)
MCHC: 34.3 g/dL (ref 30.0–36.0)
MCV: 101.3 fL — ABNORMAL HIGH (ref 80.0–100.0)
Monocytes Absolute: 0.4 K/uL (ref 0.1–1.0)
Monocytes Relative: 7 %
Neutro Abs: 3.4 K/uL (ref 1.7–7.7)
Neutrophils Relative %: 53 %
Platelets: 304 K/uL (ref 150–400)
RBC: 3.77 MIL/uL — ABNORMAL LOW (ref 3.87–5.11)
RDW: 11.9 % (ref 11.5–15.5)
WBC: 6.3 K/uL (ref 4.0–10.5)
nRBC: 0 % (ref 0.0–0.2)

## 2024-07-22 MED ORDER — IOHEXOL 300 MG/ML  SOLN: 100.0000 mL | Freq: Once | INTRAMUSCULAR | Status: DC | PRN

## 2024-07-22 MED ORDER — IOHEXOL 350 MG/ML SOLN
80.0000 mL | Freq: Once | INTRAVENOUS | Status: AC | PRN
  Administered 2024-07-22: 80 mL via INTRAVENOUS

## 2024-07-27 NOTE — Progress Notes (Signed)
 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 female was transferred to my care after her prior physician has left.   ASSESSMENT & PLAN:   Assessment & Plan: [name]  is a 43 y.o. female with moderately differentiated anal squamous cell carcinoma  Assessment & Plan Anal cancer (HCC) Stage II invasive and in situ Lampke moderately differentiated squamous of carcinoma S/p resection HPV positive Currently on surveillance  - We reviewed the CT scan together in detail.  Patient has some soft tissue thickening along the anal verge which is similar to prior.  There is some pelvic congestion like findings. - Continue to follow with Dr. Debby every 3 months  -Surveillance per NCCN guidelines:  - DRE every 3 to 6 months for 5 years, inguinal node palpation every 3 to 6 months for 5 years - Anoscopy every 6 to 12 months for 3 years - CT CAP annually for 3 years -Considering patient has pelvic congestion like signs on CT scan, will repeat CT scan in 6 months.  Return to clinic in 6 months with CT scan to review results and further management Chronic pelvic pain in female Chronic pelvic pain and abnormal menstruation worsened post-ablation, severe pain during menstruation, unable to use tampons. Seeking new gynecologist referral.  - Will provide referral to a new gynecologist in [place]. 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 Concern about vaping's impact on lung health, no immediate changes planned.    The patient understands the plans discussed today and is in agreement with them.   She knows to contact our office if she develops concerns prior to her next appointment.  40 minutes of total time was spent for this patient encounter, including preparation,review of records,  face-to-face counseling with the patient and coordination of care, physical exam, and documentation of the encounter.    LILLETTE Verneta SAUNDERS Teague,acting as a Neurosurgeon for Mickiel Dry, MD.,have documented all relevant documentation on the behalf of Mickiel Dry, MD,as directed by  Mickiel Dry, MD while in the presence of Mickiel Dry, MD.  I, Mickiel Dry MD, have reviewed the above documentation for accuracy and completeness, and I agree with the above.     Mickiel Dry, MD  [facility] [place] - A DEPT OF [facility] [address] [place] [address] Dept: [phone] Dept Fax: [phone]   Orders Placed This Encounter  Procedures   CT CHEST ABDOMEN PELVIS W CONTRAST    Standing Status:   Future    Expected Date:   01/25/2025    Expiration Date:   07/28/2025    If indicated for the ordered procedure, I authorize the administration of contrast media per Radiology protocol:   Yes    Does the patient have a contrast media/X-ray dye allergy?:   No    If indicated for the ordered procedure, I authorize the administration of oral contrast media per Radiology protocol:   Yes    Preferred imaging location?:   [facility]   CBC with Differential    Standing Status:   Future    Expected Date:  01/23/2025    Expiration Date:   04/23/2025   Comprehensive metabolic panel    Standing Status:   Future    Expected Date:   01/23/2025    Expiration Date:   04/23/2025     ONCOLOGY HISTORY:   I have reviewed her chart and materials related to her cancer extensively and collaborated history with the patient. Summary of oncologic history is as follows:   Diagnosis: Moderately differentiated squamous cell carcinoma of the perianal  region (T2 N0)   -09/2022-10/2022: Initial presentation: perianal lesion with associated pain and discomfort -03/26/2023: Patient evaluated by Dr. Evonnie kansky surgeon] with physical examination showing 3 cm ulcerative lesion in the perianal region at the 3 o'clock position.  -04/01/2023: Perianal lesion biopsy: Pathology: Invasive and in situ moderately differentiated squamous cell carcinoma.  -04/28/2023: CT CAP: Soft tissue thickening near the anal verge, which may reflect known anal malignancy or alternately external hemorrhoids. No definite evidence of lymphadenopathy or metastatic disease in the chest, abdomen, or pelvis. Faintly hyperenhancing subcapsular lesion adjacent to the gallbladder fossa, central hepatic segment IV B, measuring 2.6 x 1.4 cm. This is not typical in appearance for hepatic metastatic disease, particularly in the absence of other evidence, and a benign lesion such as focal fatty sparing, focal nodular hyperplasia, or hepatic adenoma is strongly favored. -04/30/2023: MRI Liver: No evidence of hepatic metastatic disease. Moderate hepatic steatosis. Benign focal fatty sparing in the central left hepatic lobe, which corresponds with the lesion seen on recent CT. -06/17/2023: Excision of perianal mass.  Pathology: Invasive and in situ well to moderately differentiated squamous cell carcinoma. Adjacent condyloma with high-grade dysplasia. Margins free of invasive carcinoma. High-grade dysplasia focally present in anterior medial margin  -03/14/2024: Cryoablation of anal warts -07/22/2024: CT CAP: Similar soft tissue thickening along the anal verge, possibly reflecting posttreatment change. Nodular focus of enhancement along the left lateral vaginal wall near the introitus not definitely seen on prior examination, possibly vascular in etiology given the prominent gonadal vessels seen on that side however this is nonspecific. No lymphadenopathy within the chest, abdomen or  pelvis.  Current Treatment:  Surveillance  INTERVAL HISTORY:   [name] is here today for follow up and to establish care with me for perianal squamous cell carcinoma.   Patient denies blood in stools, black colored stools, or weight loss. She previously struggled with loss of appetite, but her appetite has improved, and she eats when she wants to. .  She has a history of cervical ablation performed by Dr. Ozon, after which she experiences severe menstrual pain and is unable to use tampons due to pain, resorting to pads instead.   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 She transitioned from smoking cigarettes to vaping and has not smoked cigarettes since March of last year.   She is closely following with Dr.Thomas and has no other complaints today.  I have reviewed the past medical history, past surgical history, social history and family history with the patient and they are unchanged from previous note.  ALLERGIES:  is allergic to penicillins.  MEDICATIONS:  Current Outpatient Medications  Medication Sig Dispense Refill   acetaminophen  (TYLENOL ) 500 MG tablet Take 500-1,000 mg by mouth every 6 (six) hours as needed for moderate pain or mild pain.     Fluconazole  (DIFLUCAN  PO) Take by mouth.     fluconazole  (DIFLUCAN ) 150 MG  tablet Take one tablet once then repeat in 3 days if needed 2 tablet 2   Multiple Vitamins-Minerals (MULTIVITAMIN GUMMIES WOMENS) CHEW Chew 1 tablet by mouth daily.     traZODone (DESYREL) 100 MG tablet Take 100 mg by mouth at bedtime.     No current facility-administered medications for this visit.    REVIEW OF SYSTEMS:   Constitutional: Denies fevers, chills or abnormal weight loss Eyes: Denies blurriness of vision Ears, nose, mouth, throat, and face: Denies mucositis or sore throat Respiratory: Denies cough, dyspnea or  wheezes Cardiovascular: Denies palpitation, chest discomfort or lower extremity swelling Gastrointestinal:  Denies nausea, heartburn or change in bowel habits Skin: Denies abnormal skin rashes Lymphatics: Denies new lymphadenopathy or easy bruising Neurological:Denies numbness, tingling or new weaknesses Behavioral/Psych: Mood is stable, no new changes  All other systems were reviewed with the patient and are negative.   VITALS:  Blood pressure 114/69, pulse (!) 59, temperature 98.1 F (36.7 C), temperature source Oral, resp. rate 18, weight 184 lb 8 oz (83.7 kg), SpO2 99%.  Wt Readings from Last 3 Encounters:  07/28/24 184 lb 8 oz (83.7 kg)  01/21/24 187 lb (84.8 kg)  07/15/23 188 lb 6.4 oz (85.5 kg)    Body mass index is 28.9 kg/m.  Performance status (ECOG): 0 - Asymptomatic  PHYSICAL EXAM:   GENERAL:alert, no distress and comfortable SKIN: skin color, texture, turgor are normal, no rashes or significant lesions LYMPH:  no palpable lymphadenopathy in the cervical, axillary or inguinal LUNGS: clear to auscultation and percussion with normal breathing effort HEART: regular rate & rhythm and no murmurs and no lower extremity edema ABDOMEN:abdomen soft, non-tender and normal bowel sounds Musculoskeletal:no cyanosis of digits and no clubbing  NEURO: alert & oriented x 3 with fluent speech, no focal motor/sensory deficits  LABORATORY DATA:  I have reviewed the data as listed  Lab Results  Component Value Date   WBC 6.3 07/22/2024   NEUTROABS 3.4 07/22/2024   HGB 13.1 07/22/2024   HCT 38.2 07/22/2024   MCV 101.3 (H) 07/22/2024   PLT 304 07/22/2024     Chemistry      Component Value Date/Time   NA 138 07/22/2024 1259   K 3.5 07/22/2024 1259   CL 107 07/22/2024 1259   CO2 23 07/22/2024 1259   BUN 8 07/22/2024 1259   CREATININE 0.69 07/22/2024 1259      Component Value Date/Time   CALCIUM 9.0 07/22/2024 1259   ALKPHOS 52 07/22/2024 1259   AST 24 07/22/2024 1259    ALT 21 07/22/2024 1259   BILITOT 0.7 07/22/2024 1259       RADIOGRAPHIC STUDIES: I have personally reviewed the radiological images as listed and agreed with the findings in the report.  CT CHEST ABDOMEN PELVIS W CONTRAST CLINICAL DATA:  History of anal cancer, monitor. * Tracking Code: BO *  EXAM: CT CHEST, ABDOMEN, AND PELVIS WITH CONTRAST  TECHNIQUE: Multidetector CT imaging of the chest, abdomen and pelvis was performed following the standard protocol during bolus administration of intravenous contrast.  RADIATION DOSE REDUCTION: This exam was performed according to the departmental dose-optimization program which includes automated exposure control, adjustment of the mA and/or kV according to patient size and/or use of iterative reconstruction technique.  CONTRAST:  80mL OMNIPAQUE  IOHEXOL  350 MG/ML SOLN  COMPARISON:  Multiple priors including CT January 14, 2024  FINDINGS: CT CHEST FINDINGS  Cardiovascular: Normal caliber thoracic aorta. Normal size heart. No significant pericardial effusion/thickening.  Mediastinum/Nodes: Similar prominent  bilateral axillary lymph nodes with preserved fatty hila. No pathologically enlarged mediastinal, hilar or axillary lymph nodes.  No suspicious thyroid  nodule. The esophagus is grossly unremarkable.  Lungs/Pleura: No suspicious pulmonary nodules or masses. Scattered atelectasis/scarring.  Musculoskeletal: No aggressive lytic or blastic lesion of bone.  CT ABDOMEN PELVIS FINDINGS  Hepatobiliary: Ill-defined hypodensity along the falciform ligament for instance on image 67/2 commonly reflects focal fatty infiltration or differential perfusion. Gallbladder is unremarkable. No biliary ductal dilation.  Pancreas: No pancreatic ductal dilation or evidence of acute inflammation.  Spleen: No splenomegaly.  Adrenals/Urinary Tract: No suspicious adrenal nodule/mass. No hydronephrosis. Kidneys demonstrate symmetric  enhancement. Urinary bladder is unremarkable for degree of distension.  Stomach/Bowel: Stomach is unremarkable for degree of distension. No pathologic dilation of small or large bowel. Noninflamed appendix.  Similar soft tissue thickening along the anal verge on image 124/2.  Nodular focus of enhancement along the left lateral vaginal wall near the introitus on image 126/2 not definitely seen on prior examination.  Vascular/Lymphatic: Normal caliber abdominal aorta. Dilated left gonadal vein and pelvic collateral vessels.  No pathologically enlarged abdominal or pelvic lymph nodes.  Reproductive: Uterus and bilateral adnexa are unremarkable in CT appearance for reproductive age female.  Other: No significant abdominopelvic free fluid.  Musculoskeletal: No aggressive lytic or blastic lesion of bone.  IMPRESSION: 1. Similar soft tissue thickening along the anal verge, possibly reflecting posttreatment change. Suggest continued attention on follow-up imaging. 2. Nodular focus of enhancement along the left lateral vaginal wall near the introitus not definitely seen on prior examination, possibly vascular in etiology given the prominent gonadal vessels seen on that side however this is nonspecific and warrants further evaluation, suggest correlation with direct visualization. 3. No lymphadenopathy within the chest, abdomen or pelvis. 4. Dilated left gonadal vein and pelvic collateral vessels, which can be seen in the setting of pelvic congestion syndrome.  Electronically Signed   By: Reyes Holder M.D.   On: 07/22/2024 17:36

## 2024-07-28 ENCOUNTER — Inpatient Hospital Stay (HOSPITAL_BASED_OUTPATIENT_CLINIC_OR_DEPARTMENT_OTHER): Admitting: Oncology

## 2024-07-28 VITALS — BP 114/69 | HR 59 | Temp 98.1°F | Resp 18 | Wt 184.5 lb

## 2024-07-28 DIAGNOSIS — F17298 Nicotine dependence, other tobacco product, with other nicotine-induced disorders: Secondary | ICD-10-CM

## 2024-07-28 DIAGNOSIS — G8929 Other chronic pain: Secondary | ICD-10-CM | POA: Diagnosis not present

## 2024-07-28 DIAGNOSIS — C211 Malignant neoplasm of anal canal: Secondary | ICD-10-CM | POA: Diagnosis not present

## 2024-07-28 DIAGNOSIS — C210 Malignant neoplasm of anus, unspecified: Secondary | ICD-10-CM

## 2024-07-28 DIAGNOSIS — R102 Pelvic and perineal pain: Secondary | ICD-10-CM

## 2024-07-29 DIAGNOSIS — F17200 Nicotine dependence, unspecified, uncomplicated: Secondary | ICD-10-CM | POA: Insufficient documentation

## 2024-07-29 DIAGNOSIS — G8929 Other chronic pain: Secondary | ICD-10-CM | POA: Insufficient documentation

## 2024-07-29 NOTE — Assessment & Plan Note (Addendum)
 Transitioned from smoking to vaping since March last year. Concern about vaping's impact on lung health, no immediate changes planned.

## 2024-07-29 NOTE — Assessment & Plan Note (Signed)
 Stage II invasive and in situ Lampke moderately differentiated squamous of carcinoma S/p resection HPV positive Currently on surveillance  - We reviewed the CT scan together in detail.  Patient has some soft tissue thickening along the anal verge which is similar to prior.  There is some pelvic congestion like findings. - Continue to follow with Dr. Debby every 3 months  -Surveillance per NCCN guidelines:  - DRE every 3 to 6 months for 5 years, inguinal node palpation every 3 to 6 months for 5 years - Anoscopy every 6 to 12 months for 3 years - CT CAP annually for 3 years -Considering patient has pelvic congestion like signs on CT scan, will repeat CT scan in 6 months.  Return to clinic in 6 months with CT scan to review results and further management

## 2024-07-29 NOTE — Assessment & Plan Note (Signed)
 Chronic pelvic pain and abnormal menstruation worsened post-ablation, severe pain during menstruation, unable to use tampons. Seeking new gynecologist referral.  - Will provide referral to a new gynecologist in [place].

## 2024-09-25 ENCOUNTER — Emergency Department (HOSPITAL_COMMUNITY)
Admission: EM | Admit: 2024-09-25 | Discharge: 2024-09-25 | Disposition: A | Discharge status: Home / Self Care | Attending: Emergency Medicine | Admitting: Emergency Medicine

## 2024-09-25 ENCOUNTER — Other Ambulatory Visit: Payer: Self-pay

## 2024-09-25 ENCOUNTER — Emergency Department (HOSPITAL_COMMUNITY)

## 2024-09-25 ENCOUNTER — Encounter (HOSPITAL_COMMUNITY): Payer: Self-pay | Admitting: *Deleted

## 2024-09-25 DIAGNOSIS — D72829 Elevated white blood cell count, unspecified: Secondary | ICD-10-CM | POA: Insufficient documentation

## 2024-09-25 DIAGNOSIS — N611 Abscess of the breast and nipple: Secondary | ICD-10-CM | POA: Diagnosis present

## 2024-09-25 LAB — BASIC METABOLIC PANEL WITH GFR
Anion gap: 13 (ref 5–15)
BUN: 10 mg/dL (ref 6–20)
CO2: 17 mmol/L — ABNORMAL LOW (ref 22–32)
Calcium: 9.6 mg/dL (ref 8.9–10.3)
Chloride: 108 mmol/L (ref 98–111)
Creatinine, Ser: 0.76 mg/dL (ref 0.44–1.00)
GFR, Estimated: >60 mL/min (ref >60)
Glucose, Bld: 91 mg/dL (ref 70–99)
Potassium: 4.3 mmol/L (ref 3.5–5.1)
Sodium: 138 mmol/L (ref 135–145)

## 2024-09-25 LAB — CBC WITH DIFFERENTIAL/PLATELET
Abs Immature Granulocytes: 0.03 K/uL (ref 0.00–0.07)
Basophils Absolute: 0.1 K/uL (ref 0.0–0.1)
Basophils Relative: 1 %
Eosinophils Absolute: 0.3 K/uL (ref 0.0–0.5)
Eosinophils Relative: 3 %
HCT: 39.8 % (ref 36.0–46.0)
Hemoglobin: 13.7 g/dL (ref 12.0–15.0)
Immature Granulocytes: 0 %
Lymphocytes Relative: 27 %
Lymphs Abs: 3.1 K/uL (ref 0.7–4.0)
MCH: 33.9 pg (ref 26.0–34.0)
MCHC: 34.4 g/dL (ref 30.0–36.0)
MCV: 98.5 fL (ref 80.0–100.0)
Monocytes Absolute: 0.8 K/uL (ref 0.1–1.0)
Monocytes Relative: 7 %
Neutro Abs: 6.9 K/uL (ref 1.7–7.7)
Neutrophils Relative %: 62 %
Platelets: 275 K/uL (ref 150–400)
RBC: 4.04 MIL/uL (ref 3.87–5.11)
RDW: 11.3 % — ABNORMAL LOW (ref 11.5–15.5)
WBC: 11.2 K/uL — ABNORMAL HIGH (ref 4.0–10.5)
nRBC: 0 % (ref 0.0–0.2)

## 2024-09-25 MED ORDER — ACETAMINOPHEN 500 MG PO TABS
1000.0000 mg | ORAL_TABLET | Freq: Once | ORAL | Status: AC
  Administered 2024-09-25: 1000 mg via ORAL
  Filled 2024-09-25: qty 2

## 2024-09-25 MED ORDER — AMOXICILLIN-POT CLAVULANATE 875-125 MG PO TABS
1.0000 | ORAL_TABLET | Freq: Once | ORAL | Status: AC
  Administered 2024-09-25: 1 via ORAL
  Filled 2024-09-25: qty 1

## 2024-09-25 MED ORDER — AMOXICILLIN-POT CLAVULANATE 875-125 MG PO TABS: 1.0000 | ORAL_TABLET | Freq: Two times a day (BID) | ORAL | 0 refills | Status: AC

## 2024-09-25 NOTE — Discharge Instructions (Addendum)
 As discussed, you will need to call Dr. Ebbie tomorrow morning for follow-up appointment.  Seek emergency care if experiencing any new or worsening symptoms.  Alternating between 650 mg Tylenol  and 400 mg Advil : The best way to alternate taking Acetaminophen  (example Tylenol ) and Ibuprofen  (example Advil /Motrin ) is to take them 3 hours apart. For example, if you take ibuprofen  at 6 am you can then take Tylenol  at 9 am. You can continue this regimen throughout the day, making sure you do not exceed the recommended maximum dose for each drug.

## 2024-09-25 NOTE — ED Triage Notes (Signed)
 Pt states that since Thursday she has had a boil under her R breast area that has been increasingly red and having intermittent drainage.

## 2024-09-25 NOTE — ED Provider Notes (Signed)
 [place] EMERGENCY DEPARTMENT AT [facility] Provider Note   CSN: [id number] Arrival date & time: 09/25/24  1655     Patient presents with: No chief complaint on file.   Marie Duncan is a 43 y.o. female with non-contributory PMHx who presents to ED concerned for infection on lower/medial right breast. Patient stating that area just underneath breasts has been progressively more inflamed over the past week. Patient also endorses subjective fevers on Thursday which have since resolved. Patient also noticing a boil on right lower/medial breast and drained it on Thursday. Skin inflammation continues after cyst was drained. Patient denies any other recent infectious symptoms.   HPI     Prior to Admission medications   Medication Sig Start Date End Date Taking? Authorizing Provider  amoxicillin -clavulanate (AUGMENTIN) 875-125 MG tablet Take 1 tablet by mouth every 12 (twelve) hours for 7 days. 09/25/24 10/02/24 Yes Hoy Fraction F, PA-C  acetaminophen  (TYLENOL ) 500 MG tablet Take 500-1,000 mg by mouth every 6 (six) hours as needed for moderate pain or mild pain.    [provider]  Fluconazole  (DIFLUCAN  PO) Take by mouth.    [provider]  fluconazole  (DIFLUCAN ) 150 MG tablet Take one tablet once then repeat in 3 days if needed 11/04/23   Ozan, Jennifer, DO  Multiple Vitamins-Minerals (MULTIVITAMIN GUMMIES WOMENS) CHEW Chew 1 tablet by mouth daily.    [provider]  traZODone (DESYREL) 100 MG tablet Take 100 mg by mouth at bedtime. 03/20/23   [provider]    Allergies: Penicillins    Review of Systems  Skin:  Positive for rash.    Updated Vital Signs BP (!) 150/82 (BP Location: Right Arm)   Pulse 64   Temp 98.1 F (36.7 C) (Oral)   Resp 16   Ht 5' 7 (1.702 m)   Wt 83.7 kg   LMP 09/12/2024   SpO2 100%   BMI 28.90 kg/m   Physical Exam Vitals and nursing note reviewed.  Constitutional:      General: She is not in  acute distress.    Appearance: She is not ill-appearing or toxic-appearing.  HENT:     Head: Normocephalic and atraumatic.  Eyes:     General: No scleral icterus.       Right eye: No discharge.        Left eye: No discharge.     Conjunctiva/sclera: Conjunctivae normal.  Cardiovascular:     Rate and Rhythm: Normal rate.  Pulmonary:     Effort: Pulmonary effort is normal.  Abdominal:     General: Abdomen is flat.  Skin:    General: Skin is warm and dry.     Comments: Erythema underneath BL breasts. Small well healing area of scabbing on lower/medial right breast from prior drainage site. No obvious swelling or fluctuance.   Neurological:     General: No focal deficit present.     Mental Status: She is alert. Mental status is at baseline.  Psychiatric:        Mood and Affect: Mood normal.        Behavior: Behavior normal.     (all labs ordered are listed, but only abnormal results are displayed) Labs Reviewed  BASIC METABOLIC PANEL WITH GFR - Abnormal; Notable for the following components:      Result Value   CO2 17 (*)    All other components within normal limits  CBC WITH DIFFERENTIAL/PLATELET - Abnormal; Notable for the following components:  WBC 11.2 (*)    RDW 11.3 (*)    All other components within normal limits  CBC WITH DIFFERENTIAL/PLATELET    EKG: None  Radiology: US  LIMITED ULTRASOUND INCLUDING AXILLA RIGHT BREAST Result Date: 09/25/2024 EXAM: Targeted Ultrasound of the Right Breast 09/25/2024 07:32:02 PM TECHNIQUE: Target ultrasound of the right breast was performed. COMPARISON: None provided. CLINICAL HISTORY: [id number] Wound drainage [id number] Wound drainage FINDINGS: Within the right breast at the 3 o'clock position there is a 1.8x0.6x0.9 cm defined fluid collection or mass chest with peripheral vascularity the mass is connected to the skin surface. 2.2x0.9x1.5 cm lymph node in the right axilla. This may be reactive. Continued attention on follow up is recommended.  IMPRESSION: 1. 1.8 cm presumed abscess deep to the skin surface in the right breast at the 3 o'clock position. Associated axillary lymphadenopathy. Antibiotic treatment and further workup in a dedicated subspecialty breast clinic recommended. Electronically signed by: Norman Gatlin MD 09/25/2024 08:20 PM EST RP Workstation: [id number]     Procedures   Medications Ordered in the ED  acetaminophen  (TYLENOL ) tablet 1,000 mg (1,000 mg Oral Given 09/25/24 1832)  amoxicillin -clavulanate (AUGMENTIN) 875-125 MG per tablet 1 tablet (1 tablet Oral Given 09/25/24 2138)                                    Medical Decision Making Amount and/or Complexity of Data Reviewed Labs: ordered. Radiology: ordered.  Risk OTC drugs. Prescription drug management.    This patient presents to the ED for concern of abscess, this involves an extensive number of treatment options, and is a complaint that carries with it a high risk of complications and morbidity.  The differential diagnosis includes cellulitis, abscess, sepsis, folliculitis, necrotizing fasciitis, impetigo   Co morbidities that complicate the patient evaluation  none   Additional history obtained:  Dr. Bertell PCP   Problem List / ED Course / Critical interventions / Medication management  Patient presents to ED concerned for breast abscess. Symptoms started over the past 1 week. Physical exam with erythema underneath breasts and healing scab around 5 o'clock position on right breast.  Rest of physical exam reassuring.  Patient afebrile with stable vitals. I Ordered, and personally interpreted labs.  CBC with leukocytosis at 11.2.  No anemia.  BMP with low CO2 at 17. I ordered imaging studies including breast ultrasound. I independently visualized and interpreted imaging which showed 1.8 cm abscess in 3 o'clock position on right breast.  Associated axillary lymphadenopathy. I agree with the radiologist interpretation. Abscess is too small  to drain. Will start patient on ABX and have her follow up with breast specialist. Shared all results with patient.  Answered all questions.  Provided patient with initial dose of Augmentin in ED which she tolerated well.  Sent rest of ABX course to pharmacy.  Educated patient that she will need to follow-up with Dr. Ebbie who is our breast abscess specialist.  I have sent a referral to this doctor.  Patient agrees to call them tomorrow morning for follow-up appointment. I have reviewed the patients home medicines and have made adjustments as needed. The patient has been appropriately medically screened and/or stabilized in the ED. I have low suspicion for any other emergent medical condition which would require further screening, evaluation or treatment in the ED or require inpatient management. At time of discharge the patient is hemodynamically stable and in no acute distress. I have  discussed work-up results and diagnosis with patient and answered all questions. Patient is agreeable with discharge plan. We discussed strict return precautions for returning to the emergency department and they verbalized understanding.     Social Determinants of Health:  none      Final diagnoses:  Breast abscess    ED Discharge Orders          Ordered    Ambulatory Referral to Breast Specialist        09/25/24 2125    amoxicillin -clavulanate (AUGMENTIN) 875-125 MG tablet  Every 12 hours        09/25/24 2127               Hoy Nidia JULIANNA DEVONNA 09/25/24 2143    Francesca Elsie CROME, MD 09/28/24 [phone]

## 2024-09-30 ENCOUNTER — Ambulatory Visit: Admitting: Family Medicine

## 2024-09-30 ENCOUNTER — Encounter: Payer: Self-pay | Admitting: Family Medicine

## 2024-09-30 VITALS — BP 110/64 | HR 59 | Temp 98.8°F | Ht 67.0 in | Wt 182.5 lb

## 2024-09-30 DIAGNOSIS — Z7289 Other problems related to lifestyle: Secondary | ICD-10-CM | POA: Diagnosis not present

## 2024-09-30 DIAGNOSIS — Z716 Tobacco abuse counseling: Secondary | ICD-10-CM

## 2024-09-30 DIAGNOSIS — N611 Abscess of the breast and nipple: Secondary | ICD-10-CM

## 2024-09-30 DIAGNOSIS — E041 Nontoxic single thyroid nodule: Secondary | ICD-10-CM | POA: Diagnosis not present

## 2024-09-30 DIAGNOSIS — Z23 Encounter for immunization: Secondary | ICD-10-CM

## 2024-09-30 DIAGNOSIS — N871 Moderate cervical dysplasia: Secondary | ICD-10-CM

## 2024-09-30 MED ORDER — VARENICLINE TARTRATE 1 MG PO TABS: 1.0000 mg | ORAL_TABLET | Freq: Two times a day (BID) | ORAL | 2 refills | Status: AC

## 2024-09-30 NOTE — Progress Notes (Signed)
 Patient Office Visit  Assessment & Plan:  Breast abscess  Current every day vaping -     Varenicline Tartrate; Take 1 tablet (1 mg total) by mouth 2 (two) times daily.  Dispense: 60 tablet; Refill: 2  Needs flu shot  Thyroid  nodule  Encounter for tobacco use cessation counseling -     Varenicline Tartrate; Take 1 tablet (1 mg total) by mouth 2 (two) times daily.  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 Elevated WBC indicates infection. No systemic causes identified. - Continue Augmentin for 7 more days. - Follow up with specialist on Monday.  History of anal cancer, under active surveillance Anal cancer under active surveillance. - Continue regular follow-ups with oncology and surgery every six months. - Schedule CT scan in March.  Abnormal cervical cytology, post-ablation Post-ablation with irregular cycles and spotting. 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 - Discuss HPV vaccination with OB GYN. - Continue follow-up with new OB GYN.  Nicotine dependence, currently vaping Nicotine dependence with history of smoking. Discussed risks and Chantix for cessation. - Consider starting Chantix two weeks before a set quit date. - Discuss HPV vaccination with OB GYN.  Thyroid  nodules, stable, under surveillance Thyroid  nodules stable, no biopsy needed. - Continue surveillance with imaging next August.  General Health Maintenance Discussed importance of vaccinations including flu, tetanus, and HPV. - Consider tetanus vaccination. - Discuss HPV vaccination with OB GYN. - Schedule a physical exam.     Test results were reviewed and analyzed as part of the medical decision making of this visit.  Reviewed previous notes from oncology and primary care during the office visit.  Reviewed ER notes from November 9.  Patient will continue the Augmentin for now. We did not do  blood work today.  Patient is interested in trying the Chantix and will do this prior to the holidays.  Patient is aware of potential negative side effects.  Patient may also benefit from starting Wellbutrin to reduce tobacco cravings.  Patient will discuss starting the HPV vaccines with her OB/GYN next week.  Return for physical next time.  Patient declined the flu shot and declined a tetanus shot today.  Patient will consider the HPV vaccines but will discuss this first with her OB/GYN Return if symptoms worsen or fail to improve, for physical.   Subjective:    Patient ID: Marie Duncan, female    DOB: [birth year]  Age: 43 y.o. MRN: [id number]  Chief Complaint  Patient presents with   Medical Management of Chronic Issues   Establish Care    HPI Discussed the use of AI scribe software for clinical note transcription with the patient, who gave verbal consent to proceed.  History of Present Illness        History of Present Illness Marie Duncan is a 43 year old female who presents with a persistent breast abscess, .History of anal cancer, history of abnormal Pap smears, and tobacco use. She is here to establish primary care with our office.   She has a persistent breast abscess right side that originated from a boil and has been recurrent. The abscesses have appeared in various locations, including her side and armpit. She visited the emergency room, where she was told she had a breast abscess, and was informed that it was not large enough for fluid drainage. The abscess is enlarging again, and the patient reports  that it hurts horribly.  She is currently taking Augmentin, which she started three days before her hospital visit. She has not yet picked up the new prescription but has some left from a previous prescription. She experienced fever and chills on the day the abscess started and significant itching when she went to work. Despite the symptoms, she continued working as her job is not  physically demanding.  She has a history of anal cancer diagnosed last year, initially thought to be a hemorrhoid. It was staged to cancer but has since been reduced. She continues to have regular follow-ups with oncology and surgery every six months, with her next appointment scheduled for December 1st. She underwent a CT scan in September and has regular imaging every six months.  She has a history of smoking, having smoked a pack a day since age 54, but quit smoking cigarettes in March 2024 and now vapes occasionally. She has attempted to quit vaping but finds it challenging due to anxiety when not using it. She has not used Chantix before.  Her family history includes breast cancer in her mother. She has three children and four grandchildren. She reports a history of cervical ablation last year due to high-risk cells found during a colposcopy. Since the procedure, she experiences irregular and painful periods. Pathology last July 2024  A. ENDOCERVIX, CURETTAGE: Minute, detached atypical squamous fragment consistent with high-grade squamous intraepithelial lesion, CIN 2/CIN 3 (high-grade dysplasia).. Scanty benign endocervical mucosa.  B. CERVIX, 7 O'CLOCK, BIOPSY: High-grade squamous intraepithelial lesion, CIN-2/CIN-3 (high-grade dysplasia).  C. CERVIX, 4 O'CLOCK, BIOPSY: High-grade squamous intraepithelial lesion, CIN-2 (high-grade dysplasia).  D. CERVIX, 11 O'CLOCK, BIOPSY: High-grade squamous intraepithelial lesion, CIN-3 (high-grade dysplasia).  E. CERVIX, 1 O'CLOCK, BIOPSY: High-grade squamous intraepithelial lesion, CIN-3 (high-grade dysplasia).  GROSS DESCRIPTION: A.  Received in formalin is blood tinged mucus that is entirely submitted in one block.  Volume: 0.8 x 0.6 x 0.1 cm (1 B)  B.  Received in formalin is a tan, soft tissue fragment that is submitted in toto.  Size: 0.2 x 0.2 x 0.1 cm, 1 block submitted.  C.  Received in formalin is a tan, soft tissue  fragment that is submitted in toto.  Size: 0.7 x 0.2 x 0 point cm, 1 block submitted.  D.  Received in formalin are tan, soft tissue fragments that are submitted in toto. Number: 2 size: 0.1 and 0.2 cm blocks: 1  E.  Received in formalin is a tan, soft tissue fragment that is submitted in toto.  Size: 0.3 x 0.2 x 0.1 cm, 1 block submitted. (KW, 06/02/2023)  She has not had a flu shot in twenty years due to a previous adverse reaction. She has not had a tetanus shot recently. Her thyroid  gland was noted to have small nodules, and she has regular blood work to monitor her thyroid . She has regular blood work to monitor her thyroid , which was last checked in August.  She reports a history of scoliosis, which was noted by a previous doctor but not further investigated due to her cancer diagnosis.  Results LABS WBC: elevated (09/25/2024)  RADIOLOGY Thyroid  ultrasound: Three very small nodules, not suspicious, no biopsy recommended (06/2024)  Assessment and Plan Recurrent skin abscesses Recurrent skin abscesses with current breast abscess. Elevated WBC indicates infection. No systemic causes identified. - Continue Augmentin for 7 more days. - Follow up with specialist on Monday.  History of anal cancer, under active surveillance Anal cancer under active surveillance.  Was told  that the anal cancer was likely related to HPV. - Continue regular follow-ups with oncology and surgery every six months. - Schedule CT scan in March.  Abnormal cervical cytology, post-ablation Post-ablation with irregular cycles and spotting. 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 - Discuss HPV vaccination with OB GYN. - Continue follow-up with new OB GYN.  Nicotine dependence, currently vaping Nicotine dependence with history of smoking. Discussed risks and Chantix for cessation. - Consider starting Chantix two weeks before a set quit date. History of anal cancer/abnormal Pap smear-discuss HPV  vaccination with OB GYN.  Thyroid  nodules, stable, under surveillance Thyroid  nodules stable, no biopsy needed. - Continue surveillance with imaging next August.  General Health Maintenance Discussed importance of vaccinations including flu, tetanus, and HPV. - Consider tetanus vaccination. - Discuss HPV vaccination with OB GYN. - Schedule a physical exam.    The ASCVD Risk score (Arnett DK, et al., 2019) failed to calculate for the following reasons:   Cannot find a previous HDL lab   Cannot find a previous total cholesterol lab  Past Medical History:  Diagnosis Date   Anal cancer (HCC) 03/2023   oncology--- dr rogers;  moderateraly differeentated SCC perianal region  s/p rectal bx 04-01-2023 by dr cindie   Anxiety    Complication of anesthesia    sts after rectal mass removal 7/31, she had a rash during surgery that lasted 3 days postop. She was given benadryl  in the OR, but they are not sure which med caused the rash.   Intermittent palpitations    06-01-2023  per pt notices more in the evening ,  stated her pcp is aware, in past year since ,   not all the time , ekg done was normal   Irregular heart beats    Past Surgical History:  Procedure Laterality Date   CERVICAL ABLATION N/A 07/07/2023   Procedure: CERVICAL ABLATION;  Surgeon: Ozan, Jennifer, DO;  Location: [facility];  Service: Gynecology;  Laterality: N/A;   COLONOSCOPY WITH PROPOFOL  N/A 04/22/2023   Procedure: COLONOSCOPY WITH PROPOFOL ;  Surgeon: Cindie Carlin POUR, DO;  Location: [facility];  Service: Endoscopy;  Laterality: N/A;  1:15 pm, asa 2   COLPOSCOPY VULVA W/ BIOPSY  06/02/2023   MASS EXCISION N/A 06/17/2023   Procedure: EXCISION ANAL MASS;  Surgeon: Debby Hila, MD;  Location: [facility];  Service: General;  Laterality: N/A;   POLYPECTOMY  04/22/2023   Procedure: POLYPECTOMY;  Surgeon: Cindie Carlin POUR, DO;  Location: [facility];  Service: Endoscopy;;   RECTAL BIOPSY N/A 04/01/2023   Procedure:  PERIANAL LESION BIOPSY;  Surgeon: Evonnie Dorothyann LABOR, DO;  Location: [facility];  Service: General;  Laterality: N/A;   TUBAL LIGATION Bilateral    Social History   Tobacco Use   Smoking status: Former    Current packs/day: 0.00    Average packs/day: 1 pack/day for 25.0 years (25.0 ttl pk-yrs)    Types: Cigarettes    Quit date: 01/17/2023    Years since quitting: 1.7   Smokeless tobacco: Never  Vaping Use   Vaping status: Some Days   Substances: Nicotine, Flavoring   Devices:  it varies  Substance Use Topics   Alcohol use: No   Drug use: Not Currently    Types: Marijuana    Comment: occassional   Family History  Problem Relation Age of Onset   Breast cancer Mother    Alcohol abuse Father    Drug abuse Father    Early  death Father    Hearing loss Maternal Grandmother    Heart disease Maternal Grandmother    Kidney cancer Maternal Grandmother    Prostate cancer Maternal Grandfather    Colon cancer Neg Hx    Ovarian cancer Neg Hx    Uterine cancer Neg Hx    Allergies  Allergen Reactions   Penicillins     Unknown childhood reaction    ROS    Objective:    BP 110/64   Pulse (!) 59   Temp 98.8 F (37.1 C)   Ht 5' 7 (1.702 m)   Wt 182 lb 8 oz (82.8 kg)   LMP 09/12/2024   SpO2 99%   BMI 28.58 kg/m  BP Readings from Last 3 Encounters:  09/30/24 110/64  09/25/24 (!) 150/82  07/28/24 114/69   Wt Readings from Last 3 Encounters:  09/30/24 182 lb 8 oz (82.8 kg)  09/25/24 184 lb 8.4 oz (83.7 kg)  07/28/24 184 lb 8 oz (83.7 kg)    Physical Exam Vitals and nursing note reviewed.  Constitutional:      General: She is not in acute distress.    Appearance: Normal appearance.  HENT:     Head: Normocephalic.     Right Ear: Tympanic membrane, ear canal and external ear normal.     Left Ear: Tympanic membrane, ear canal and external ear normal.  Eyes:     Extraocular Movements: Extraocular movements intact.     Conjunctiva/sclera: Conjunctivae normal.      Pupils: Pupils are equal, round, and reactive to light.  Cardiovascular:     Rate and Rhythm: Normal rate and regular rhythm.     Heart sounds: Normal heart sounds.  Pulmonary:     Effort: Pulmonary effort is normal.     Breath sounds: Normal breath sounds. No wheezing or rhonchi.  Abdominal:     Tenderness: There is no abdominal tenderness.  Musculoskeletal:     Right lower leg: No edema.     Left lower leg: No edema.  Skin:    Findings: Erythema and lesion present.     Comments: Has abscess under right breast approx 4 cm by 2.5 cm, swollen and tender to palpation. No drainage noted.  Has residual skin lesion left thigh area that is healing  Neurological:     General: No focal deficit present.     Mental Status: She is alert and oriented to person, place, and time.  Psychiatric:        Mood and Affect: Mood normal.        Behavior: Behavior normal.        Thought Content: Thought content normal.        Judgment: Judgment normal.      No results found for any visits on 09/30/24.

## 2024-10-03 ENCOUNTER — Encounter: Payer: Self-pay | Admitting: Family Medicine

## 2024-10-05 ENCOUNTER — Encounter: Admitting: Obstetrics and Gynecology

## 2024-10-05 ENCOUNTER — Other Ambulatory Visit: Payer: Self-pay

## 2024-10-05 DIAGNOSIS — N611 Abscess of the breast and nipple: Secondary | ICD-10-CM

## 2024-11-29 ENCOUNTER — Other Ambulatory Visit: Payer: Self-pay | Admitting: General Surgery

## 2024-11-29 DIAGNOSIS — L02213 Cutaneous abscess of chest wall: Secondary | ICD-10-CM

## 2024-12-15 ENCOUNTER — Encounter: Admitting: Obstetrics and Gynecology

## 2025-01-25 ENCOUNTER — Encounter: Admitting: Family Medicine

## 2025-01-27 ENCOUNTER — Inpatient Hospital Stay

## 2025-01-27 ENCOUNTER — Ambulatory Visit (HOSPITAL_COMMUNITY)

## 2025-02-03 ENCOUNTER — Ambulatory Visit: Admitting: Oncology
